# Patient Record
Sex: Female | Born: 1945 | Race: White | Hispanic: No | Marital: Married | Smoking: Former smoker
Health system: Southern US, Community
[De-identification: ages and names within clinical notes are randomized; demographics above are authoritative.]

## PROBLEM LIST (undated history)

## (undated) DIAGNOSIS — F0390 Unspecified dementia without behavioral disturbance: Secondary | ICD-10-CM

## (undated) DIAGNOSIS — E039 Hypothyroidism, unspecified: Secondary | ICD-10-CM

## (undated) DIAGNOSIS — E7800 Pure hypercholesterolemia, unspecified: Secondary | ICD-10-CM

## (undated) HISTORY — PX: HYSTEROTOMY: SHX1776

---

## 2005-11-05 ENCOUNTER — Encounter: Admission: RE | Admit: 2005-11-05 | Discharge: 2005-11-05 | Payer: Self-pay | Admitting: Family Medicine

## 2008-02-29 ENCOUNTER — Encounter: Admission: RE | Admit: 2008-02-29 | Discharge: 2008-02-29 | Payer: Self-pay | Admitting: Family Medicine

## 2009-09-04 ENCOUNTER — Emergency Department (HOSPITAL_COMMUNITY): Admission: EM | Admit: 2009-09-04 | Discharge: 2009-09-04 | Payer: Self-pay | Admitting: Emergency Medicine

## 2010-05-11 ENCOUNTER — Encounter: Payer: Self-pay | Admitting: Family Medicine

## 2012-05-22 ENCOUNTER — Encounter (HOSPITAL_COMMUNITY): Payer: Self-pay

## 2012-05-22 ENCOUNTER — Emergency Department (HOSPITAL_COMMUNITY)
Admission: EM | Admit: 2012-05-22 | Discharge: 2012-05-22 | Disposition: A | Discharge status: Home Health | Payer: Medicare Other | Attending: Emergency Medicine | Admitting: Emergency Medicine

## 2012-05-22 DIAGNOSIS — E7800 Pure hypercholesterolemia, unspecified: Secondary | ICD-10-CM | POA: Insufficient documentation

## 2012-05-22 DIAGNOSIS — E039 Hypothyroidism, unspecified: Secondary | ICD-10-CM | POA: Insufficient documentation

## 2012-05-22 DIAGNOSIS — F068 Other specified mental disorders due to known physiological condition: Secondary | ICD-10-CM | POA: Insufficient documentation

## 2012-05-22 DIAGNOSIS — Z79899 Other long term (current) drug therapy: Secondary | ICD-10-CM | POA: Insufficient documentation

## 2012-05-22 DIAGNOSIS — R197 Diarrhea, unspecified: Secondary | ICD-10-CM | POA: Insufficient documentation

## 2012-05-22 DIAGNOSIS — Y92009 Unspecified place in unspecified non-institutional (private) residence as the place of occurrence of the external cause: Secondary | ICD-10-CM | POA: Insufficient documentation

## 2012-05-22 DIAGNOSIS — R7401 Elevation of levels of liver transaminase levels: Secondary | ICD-10-CM | POA: Insufficient documentation

## 2012-05-22 DIAGNOSIS — R1110 Vomiting, unspecified: Secondary | ICD-10-CM | POA: Insufficient documentation

## 2012-05-22 DIAGNOSIS — R7402 Elevation of levels of lactic acid dehydrogenase (LDH): Secondary | ICD-10-CM | POA: Insufficient documentation

## 2012-05-22 DIAGNOSIS — Y939 Activity, unspecified: Secondary | ICD-10-CM | POA: Insufficient documentation

## 2012-05-22 DIAGNOSIS — IMO0002 Reserved for concepts with insufficient information to code with codable children: Secondary | ICD-10-CM | POA: Insufficient documentation

## 2012-05-22 DIAGNOSIS — T551X1A Toxic effect of detergents, accidental (unintentional), initial encounter: Secondary | ICD-10-CM | POA: Insufficient documentation

## 2012-05-22 HISTORY — DX: Pure hypercholesterolemia, unspecified: E78.00

## 2012-05-22 HISTORY — DX: Hypothyroidism, unspecified: E03.9

## 2012-05-22 HISTORY — DX: Unspecified dementia, unspecified severity, without behavioral disturbance, psychotic disturbance, mood disturbance, and anxiety: F03.90

## 2012-05-22 LAB — COMPREHENSIVE METABOLIC PANEL
BUN: 19 mg/dL (ref 6–23)
CO2: 26 mEq/L (ref 19–32)
Chloride: 97 mEq/L (ref 96–112)
Creatinine, Ser: 1.06 mg/dL (ref 0.50–1.10)
GFR calc Af Amer: 62 mL/min — ABNORMAL LOW (ref >90)
GFR calc non Af Amer: 54 mL/min — ABNORMAL LOW (ref >90)
Glucose, Bld: 158 mg/dL — ABNORMAL HIGH (ref 70–99)
Total Bilirubin: 0.5 mg/dL (ref 0.3–1.2)

## 2012-05-22 LAB — URINALYSIS, ROUTINE W REFLEX MICROSCOPIC
Glucose, UA: NEGATIVE mg/dL
Ketones, ur: NEGATIVE mg/dL
Leukocytes, UA: NEGATIVE
Nitrite: NEGATIVE
Protein, ur: NEGATIVE mg/dL

## 2012-05-22 LAB — CBC WITH DIFFERENTIAL/PLATELET
HCT: 43.1 % (ref 36.0–46.0)
Hemoglobin: 14.9 g/dL (ref 12.0–15.0)
Lymphocytes Relative: 8 % — ABNORMAL LOW (ref 12–46)
Monocytes Absolute: 0.9 10*3/uL (ref 0.1–1.0)
Monocytes Relative: 7 % (ref 3–12)
Neutro Abs: 10.9 10*3/uL — ABNORMAL HIGH (ref 1.7–7.7)
WBC: 12.8 10*3/uL — ABNORMAL HIGH (ref 4.0–10.5)

## 2012-05-22 LAB — ETHANOL: Alcohol, Ethyl (B): <11 mg/dL (ref 0–11)

## 2012-05-22 MED ORDER — ONDANSETRON HCL 4 MG/2ML IJ SOLN
4.0000 mg | Freq: Once | INTRAMUSCULAR | Status: AC
  Administered 2012-05-22: 4 mg via INTRAVENOUS
  Filled 2012-05-22: qty 2

## 2012-05-22 NOTE — ED Notes (Signed)
Pt has dementia and is non verbal. Lives at home with her husband.  Pt drank liquid dish detergent on Saturday around 1500 ([place])  EMS reports that pt has had diarrhea and vomiting since incident.  Pt alert on arrival to ED

## 2012-05-22 NOTE — ED Provider Notes (Signed)
History     CSN: [id number]  Arrival date & time 05/22/12  0230   First MD Initiated Contact with Patient 05/22/12 0327      Chief Complaint  Patient presents with  . Near Syncope    (Consider location/radiation/quality/duration/timing/severity/associated sxs/prior treatment) The history is provided by the spouse. The history is limited by the condition of the patient (dementia).   67 year old female drank Ajax cleaner at about 3 PM. History is from her husband because she is completely nonverbal secondary to dementia. He states that she did mix it with water and is not sure how much she drank other than he doesn't think was a lot. Several hours later, she had vomiting and diarrhea and this happened a second time about 8 hours later so he decided to bring her into the emergency department. She has had no complaints of. He does not think that she took any other substances or medications.  Past Medical History  Diagnosis Date  . Dementia   . Hypothyroid   . Hypercholesteremia     No past surgical history on file.  No family history on file.  History  Substance Use Topics  . Smoking status: Not on file  . Smokeless tobacco: Not on file  . Alcohol Use:     OB History    Grav Para Term Preterm Abortions TAB SAB Ect Mult Living                  Review of Systems  Unable to perform ROS: Dementia    Allergies  Review of patient's allergies indicates no known allergies.  Home Medications   Current Outpatient Rx  Name  Route  Sig  Dispense  Refill  . DONEPEZIL HCL 10 MG PO TABS   Oral   Take 10 mg by mouth at bedtime as needed.         Marland Kitchen ESTROGENS CONJUGATED 0.3 MG PO TABS   Oral   Take 0.3 mg by mouth daily.         Marland Kitchen LEVOTHYROXINE SODIUM 88 MCG PO TABS   Oral   Take 88 mcg by mouth daily.         Marland Kitchen MEMANTINE HCL 10 MG PO TABS   Oral   Take 10 mg by mouth 2 (two) times daily.         Marland Kitchen MIRABEGRON ER 25 MG PO TB24   Oral   Take 25 mg by mouth  daily.         Marland Kitchen SIMVASTATIN 40 MG PO TABS   Oral   Take 40 mg by mouth every evening.           BP 113/63  Pulse 95  Temp 98.1 F (36.7 C) (Oral)  Resp 20  SpO2 93%  Physical Exam  Nursing note and vitals reviewed.  67 year old female, resting comfortably and in no acute distress. Vital signs are normal. Oxygen saturation is 93%, which is normal. Head is normocephalic and atraumatic. PERRLA, EOMI. Oropharynx is clear. Neck is nontender and supple without adenopathy or JVD. Back is nontender and there is no CVA tenderness. Lungs are clear without rales, wheezes, or rhonchi. Chest is nontender. Heart has regular rate and rhythm without murmur. Abdomen is soft, flat, nontender without masses or hepatosplenomegaly and peristalsis is normoactive. Extremities have no cyanosis or edema, full range of motion is present. Skin is warm and dry without rash. Neurologic: She is completely nonverbal and does not follow commands, cranial  nerves are grossly intact, there are no gross motor or sensory deficits.  ED Course  Procedures (including critical care time)  Results for orders placed during the hospital encounter of 05/22/12  CBC WITH DIFFERENTIAL      Component Value Range   WBC 12.8 (*) 4.0 - 10.5 K/uL   RBC 4.94  3.87 - 5.11 MIL/uL   Hemoglobin 14.9  12.0 - 15.0 g/dL   HCT 11.9  14.7 - 82.9 %   MCV 87.2  78.0 - 100.0 fL   MCH 30.2  26.0 - 34.0 pg   MCHC 34.6  30.0 - 36.0 g/dL   RDW 56.2  13.0 - 86.5 %   Platelets 276  150 - 400 K/uL   Neutrophils Relative 85 (*) 43 - 77 %   Neutro Abs 10.9 (*) 1.7 - 7.7 K/uL   Lymphocytes Relative 8 (*) 12 - 46 %   Lymphs Abs 1.0  0.7 - 4.0 K/uL   Monocytes Relative 7  3 - 12 %   Monocytes Absolute 0.9  0.1 - 1.0 K/uL   Eosinophils Relative 0  0 - 5 %   Eosinophils Absolute 0.0  0.0 - 0.7 K/uL   Basophils Relative 0  0 - 1 %   Basophils Absolute 0.0  0.0 - 0.1 K/uL  COMPREHENSIVE METABOLIC PANEL      Component Value Range    Sodium 139  135 - 145 mEq/L   Potassium 4.2  3.5 - 5.1 mEq/L   Chloride 97  96 - 112 mEq/L   CO2 26  19 - 32 mEq/L   Glucose, Bld 158 (*) 70 - 99 mg/dL   BUN 19  6 - 23 mg/dL   Creatinine, Ser 7.84  0.50 - 1.10 mg/dL   Calcium 69.6 (*) 8.4 - 10.5 mg/dL   Total Protein 8.3  6.0 - 8.3 g/dL   Albumin 4.2  3.5 - 5.2 g/dL   AST 39 (*) 0 - 37 U/L   ALT 46 (*) 0 - 35 U/L   Alkaline Phosphatase 73  39 - 117 U/L   Total Bilirubin 0.5  0.3 - 1.2 mg/dL   GFR calc non Af Amer 54 (*) >90 mL/min   GFR calc Af Amer 62 (*) >90 mL/min  ETHANOL      Component Value Range   Alcohol, Ethyl (B) <11  0 - 11 mg/dL  URINALYSIS, ROUTINE W REFLEX MICROSCOPIC      Component Value Range   Color, Urine YELLOW  YELLOW   APPearance CLEAR  CLEAR   Specific Gravity, Urine 1.023  1.005 - 1.030   pH 5.0  5.0 - 8.0   Glucose, UA NEGATIVE  NEGATIVE mg/dL   Hgb urine dipstick NEGATIVE  NEGATIVE   Bilirubin Urine NEGATIVE  NEGATIVE   Ketones, ur NEGATIVE  NEGATIVE mg/dL   Protein, ur NEGATIVE  NEGATIVE mg/dL   Urobilinogen, UA 0.2  0.0 - 1.0 mg/dL   Nitrite NEGATIVE  NEGATIVE   Leukocytes, UA NEGATIVE  NEGATIVE  SALICYLATE LEVEL      Component Value Range   Salicylate Lvl <2.0 (*) 2.8 - 20.0 mg/dL  ACETAMINOPHEN LEVEL      Component Value Range   Acetaminophen (Tylenol), Serum <15.0  10 - 30 ug/mL    1. Ingestion of detergent or soap   2. Elevated transaminase level   3. Hypercalcemia       MDM  Ingestion of detergent. Vomiting and diarrhea are expected side effects but I  do not see evidence of true toxicity. Case is been discussed with [facility] poison control who recommended observation for one hour and then trial of low oral fluids as well as checking routine laboratory tests. CBC and complete metabolic panel as well as ethanol level and salicylate level and acetaminophen level have been ordered.  Laboratory workup is significant for mild elevation of transaminases and mild hypercalcemia. She  tolerated oral fluids well so she will be discharged with followup with gastroenterology. Laboratory abnormalities will need to be followed up through her PCP.  Dione Booze, MD 05/22/12 [phone]

## 2012-05-22 NOTE — ED Notes (Signed)
Husband home to get pt some clothes

## 2012-05-22 NOTE — ED Notes (Signed)
Pt drinking soda, tolerating well. Pt remains alert.

## 2012-05-22 NOTE — ED Notes (Signed)
Husband back at bedside.  Pt tolerated In and out cath well for urine.  Perineal area red with liquid stool in brief. Area cleaned and protective ointment applied.

## 2012-06-06 ENCOUNTER — Emergency Department (HOSPITAL_COMMUNITY): Payer: Medicare Other

## 2012-06-06 ENCOUNTER — Emergency Department (HOSPITAL_COMMUNITY)
Admission: EM | Admit: 2012-06-06 | Discharge: 2012-06-06 | Disposition: A | Discharge status: Home / Self Care | Payer: Medicare Other | Attending: Emergency Medicine | Admitting: Emergency Medicine

## 2012-06-06 DIAGNOSIS — Z79899 Other long term (current) drug therapy: Secondary | ICD-10-CM | POA: Insufficient documentation

## 2012-06-06 DIAGNOSIS — E039 Hypothyroidism, unspecified: Secondary | ICD-10-CM | POA: Insufficient documentation

## 2012-06-06 DIAGNOSIS — IMO0002 Reserved for concepts with insufficient information to code with codable children: Secondary | ICD-10-CM | POA: Insufficient documentation

## 2012-06-06 DIAGNOSIS — E162 Hypoglycemia, unspecified: Secondary | ICD-10-CM | POA: Insufficient documentation

## 2012-06-06 DIAGNOSIS — F0391 Unspecified dementia with behavioral disturbance: Secondary | ICD-10-CM | POA: Insufficient documentation

## 2012-06-06 DIAGNOSIS — F03918 Unspecified dementia, unspecified severity, with other behavioral disturbance: Secondary | ICD-10-CM | POA: Insufficient documentation

## 2012-06-06 DIAGNOSIS — R450 Nervousness: Secondary | ICD-10-CM | POA: Insufficient documentation

## 2012-06-06 DIAGNOSIS — E7800 Pure hypercholesterolemia, unspecified: Secondary | ICD-10-CM | POA: Insufficient documentation

## 2012-06-06 DIAGNOSIS — F0390 Unspecified dementia without behavioral disturbance: Secondary | ICD-10-CM

## 2012-06-06 LAB — COMPREHENSIVE METABOLIC PANEL
ALT: 38 U/L — ABNORMAL HIGH (ref 0–35)
AST: 33 U/L (ref 0–37)
Albumin: 3.5 g/dL (ref 3.5–5.2)
Alkaline Phosphatase: 64 U/L (ref 39–117)
BUN: 10 mg/dL (ref 6–23)
CO2: 23 mEq/L (ref 19–32)
Calcium: 9.2 mg/dL (ref 8.4–10.5)
Chloride: 100 mEq/L (ref 96–112)
Creatinine, Ser: 0.74 mg/dL (ref 0.50–1.10)
GFR calc Af Amer: >90 mL/min (ref >90)
GFR calc non Af Amer: 87 mL/min — ABNORMAL LOW (ref >90)
Glucose, Bld: 90 mg/dL (ref 70–99)
Potassium: 3.4 mEq/L — ABNORMAL LOW (ref 3.5–5.1)
Sodium: 139 mEq/L (ref 135–145)
Total Bilirubin: 0.3 mg/dL (ref 0.3–1.2)
Total Protein: 6.8 g/dL (ref 6.0–8.3)

## 2012-06-06 LAB — URINALYSIS, ROUTINE W REFLEX MICROSCOPIC
Glucose, UA: NEGATIVE mg/dL
Hgb urine dipstick: NEGATIVE
Protein, ur: NEGATIVE mg/dL

## 2012-06-06 LAB — CBC WITH DIFFERENTIAL/PLATELET
Basophils Absolute: 0 10*3/uL (ref 0.0–0.1)
Basophils Relative: 0 % (ref 0–1)
Eosinophils Absolute: 0.1 10*3/uL (ref 0.0–0.7)
Eosinophils Relative: 1 % (ref 0–5)
HCT: 38.7 % (ref 36.0–46.0)
Hemoglobin: 13 g/dL (ref 12.0–15.0)
Lymphocytes Relative: 25 % (ref 12–46)
Lymphs Abs: 1.2 10*3/uL (ref 0.7–4.0)
MCH: 28.6 pg (ref 26.0–34.0)
MCHC: 33.6 g/dL (ref 30.0–36.0)
MCV: 85.2 fL (ref 78.0–100.0)
Monocytes Absolute: 0.4 10*3/uL (ref 0.1–1.0)
Monocytes Relative: 7 % (ref 3–12)
Neutro Abs: 3.4 10*3/uL (ref 1.7–7.7)
Neutrophils Relative %: 67 % (ref 43–77)
Platelets: 260 10*3/uL (ref 150–400)
RBC: 4.54 MIL/uL (ref 3.87–5.11)
RDW: 12.7 % (ref 11.5–15.5)
WBC: 5 10*3/uL (ref 4.0–10.5)

## 2012-06-06 LAB — ETHANOL: Alcohol, Ethyl (B): <11 mg/dL (ref 0–11)

## 2012-06-06 LAB — RAPID URINE DRUG SCREEN, HOSP PERFORMED
Amphetamines: NOT DETECTED
Barbiturates: NOT DETECTED
Benzodiazepines: NOT DETECTED
Cocaine: NOT DETECTED
Opiates: NOT DETECTED
Tetrahydrocannabinol: NOT DETECTED

## 2012-06-06 LAB — GLUCOSE, CAPILLARY
Glucose-Capillary: 65 mg/dL — ABNORMAL LOW (ref 70–99)
Glucose-Capillary: 98 mg/dL (ref 70–99)

## 2012-06-06 LAB — AMMONIA: Ammonia: 18 umol/L (ref 11–60)

## 2012-06-06 MED ORDER — SODIUM CHLORIDE 0.9 % IV SOLN
1000.0000 mL | INTRAVENOUS | Status: DC
  Administered 2012-06-06: 1000 mL via INTRAVENOUS

## 2012-06-06 NOTE — ED Notes (Signed)
Attempted IV start x 2 without success. IV team paged.

## 2012-06-06 NOTE — ED Notes (Signed)
[id number]> Expected date:<BR> Expected time:<BR> Means of arrival:<BR> Comments:<BR> Pt is in room will be registered as Brandi Schneider

## 2012-06-06 NOTE — ED Notes (Signed)
Pt's husband at bedside. Registration notified to change pt's name and chart.

## 2012-06-06 NOTE — Progress Notes (Signed)
CSW met with pt spouse while patient was in xray. Per patient spouse, Pt name is Brandi Schneider. Mr. Vezina shared that Pt has history of dementia and is only oriented to self. Pt spouse stated that they went to [facility], and she had wandered off. Pt spouse stated that he looked for over 2 hours with security, and then called the police department who informed pt spouse pt was at the hospital. Pt spouse shared that he has tried to get patient into an assisted living for memory care but unable to afford it. Pt spouse stated that he has not applied for medicaid because there was no openings for medicaid beds. CSW and pt discussed the need to apply for medicaid to receive any financial assistance for assisted living. CSW provided pt spouse with medicaid information as well as list of assisted living facilities. Pt spouse plans to look into applying for medicaid and looking into assisted living facilities. CSW also provided patient with private care duty nurses to assist with pt needs at home. CSW discussed with pt spouse that once an assisted living facility is identified, pt pcp will need to complete and paperwork requested by facility, including the fl2.   CSW awaiting further medical evaluation to determine pt disposition needs. At this time plan is for pt to return home with pt spouse.   Catha Gosselin, LCSWA  [phone] .06/06/2012 1742pm

## 2012-06-06 NOTE — ED Notes (Signed)
Pt BIB EMS. Pt was found wandering around by [employer]. Pt has no signs of injury or trauma. Pt is nonverbal, but wrote down her name per EMS. Pt will not write down her birthday or identify herself in any other way. Pt arrives alert, makes eye contact will acknowledge her name is Brandi Schneider. Pt remains nonverbal to any questions. Pt calm, cooperative with staff.

## 2012-06-06 NOTE — ED Provider Notes (Signed)
History     CSN: [id number]  Arrival date & time 06/06/12  1550   First MD Initiated Contact with Patient 06/06/12 1649      Chief Complaint  Patient presents with  . Altered Mental Status    (Consider location/radiation/quality/duration/timing/severity/associated sxs/prior treatment) HPI Comments: Myriam Jacobson is a  Female who was found wandering outside by the police. She was transported here for evaluation. The only thing that she is able to communicate, is that her name is "Hector".She looks at the the examiner, and follows commands, but will not answer questions. She frequently repeats the same words that the examiner asks; as if she is asking the question.  Level V caveat -altered mental status   Patient is a 67 y.o. female presenting with altered mental status. The history is provided by the patient.  Altered Mental Status    No past medical history on file.  No past surgical history on file.  No family history on file.  History  Substance Use Topics  . Smoking status: Not on file  . Smokeless tobacco: Not on file  . Alcohol Use: Not on file    OB History   No data available      Review of Systems  Unable to perform ROS Psychiatric/Behavioral: Positive for altered mental status.    Allergies  Review of patient's allergies indicates no known allergies.  Home Medications   Current Outpatient Rx  Name  Route  Sig  Dispense  Refill  . donepezil (ARICEPT) 10 MG tablet   Oral   Take 10 mg by mouth every morning.         . estrogens, conjugated, (PREMARIN) 0.3 MG tablet   Oral   Take 0.3 mg by mouth every morning.         Marland Kitchen levothyroxine (SYNTHROID, LEVOTHROID) 88 MCG tablet   Oral   Take 88 mcg by mouth every morning.         . memantine (NAMENDA) 10 MG tablet   Oral   Take 10 mg by mouth 2 (two) times daily.         . mirabegron ER (MYRBETRIQ) 25 MG TB24   Oral   Take 25 mg by mouth every morning.         . simvastatin (ZOCOR) 40 MG  tablet   Oral   Take 40 mg by mouth every morning.           BP 136/75  Pulse 84  Temp(Src) 98.1 F (36.7 C) (Oral)  Resp 18  SpO2 98%  Physical Exam  Nursing note and vitals reviewed. Constitutional: She appears well-developed and well-nourished. She appears distressed (Appears uncomfortable, nervous).  HENT:  Head: Normocephalic and atraumatic.  Eyes: Conjunctivae and EOM are normal. Pupils are equal, round, and reactive to light.  Neck: Normal range of motion and phonation normal. Neck supple.  Cardiovascular: Normal rate, regular rhythm and intact distal pulses.   Pulmonary/Chest: Effort normal and breath sounds normal. She exhibits no tenderness.  Abdominal: Soft. She exhibits no distension. There is no tenderness. There is no guarding.  Musculoskeletal: Normal range of motion.  Neurological: She is alert. She has normal strength. She exhibits normal muscle tone (Strength 5 over 5 bilateral). Coordination normal.  Will not answer questions regarding orientation. No dysarthria. Unable to assess for aphasia.   Skin: Skin is warm and dry.  Psychiatric:  Depressed facies, anxious    ED Course  Procedures (including critical care time)  ED evaluation, for  causes of delirium , initiated; 17:15.    Date: 02/05/2012  Rate: 77  Rhythm: normal sinus rhythm  QRS Axis: normal  PR and QT Intervals: normal  ST/T Wave abnormalities: normal  PR and QRS Conduction Disutrbances:right bundle branch block  Narrative Interpretation:   Old EKG Reviewed: none available    Patient's husband arrived, and was able to give further history. He was with her at [facility] today, when she wandered away. He could not find her. He ultimately figured out she was brought to the hospital and came here to retrieve her. She feels like she is at her baseline. She has history of dementia and follows closely with her primary care Dr. Geronimo Running not had any recent illnesses.  Labs Reviewed  COMPREHENSIVE  METABOLIC PANEL - Abnormal; Notable for the following:    Potassium 3.4 (*)    ALT 38 (*)    GFR calc non Af Amer 87 (*)    All other components within normal limits  URINALYSIS, ROUTINE W REFLEX MICROSCOPIC - Abnormal; Notable for the following:    Bilirubin Urine SMALL (*)    Ketones, ur 40 (*)    All other components within normal limits  GLUCOSE, CAPILLARY - Abnormal; Notable for the following:    Glucose-Capillary 65 (*)    All other components within normal limits  URINE CULTURE  AMMONIA  URINE RAPID DRUG SCREEN (HOSP PERFORMED)  ETHANOL  LACTIC ACID, PLASMA  CBC WITH DIFFERENTIAL  GLUCOSE, CAPILLARY   Dg Chest 2 View  06/06/2012  *RADIOLOGY REPORT*  Clinical Data: Confusion, altered mental status.  CHEST - 2 VIEW  Comparison: None.  Findings: Relatively low lung volumes with atelectasis, scarring, or infiltrates in both lung bases, left greater than right.  The heart size upper limits normal for technique.  Atheromatous aorta. No effusion.  Regional bones unremarkable.  IMPRESSION:  Low volumes with bibasilar atelectasis, infiltrate or scarring.   Original Report Authenticated By: D. Andria Rhein, MD    Ct Head Wo Contrast  06/06/2012  *RADIOLOGY REPORT*  Clinical Data:   Altered mental status  CT HEAD WITHOUT CONTRAST  Technique:  Contiguous axial images were obtained from the base of the skull through the vertex without contrast.  Comparison: None currently available  Findings: Diffuse parenchymal atrophy especially bilateral frontal lobes. Patchy areas of hypoattenuation in deep and periventricular white matter bilaterally. Negative for acute intracranial hemorrhage, mass lesion, acute infarction, midline shift, or mass- effect. Acute infarct may be inapparent on noncontrast CT. Ventricles and sulci symmetric. Bone windows demonstrate no focal lesion.  IMPRESSION:  1. Negative for bleed or other acute intracranial process.  2. Atrophy and nonspecific white matter changes   Original  Report Authenticated By: D. Andria Rhein, MD    Nursing notes, applicable records and vitals reviewed.  Radiologic Images/Reports reviewed.   1. Hypoglycemia   2. Dementia       MDM  Dementia with wandering behavior. Doubt new brain disorder. Incidental, hypoglycemia, likely related to being away from her usual milieu. Doubt metabolic instability, serious bacterial infection or impending vascular collapse; the patient is stable for discharge.    Plan: Home Medications- usual; Home Treatments- close observation; Recommended follow up- PCP prn      Flint Melter, MD 06/06/12 2332

## 2012-06-06 NOTE — ED Notes (Signed)
IV team RN at bedside.  

## 2012-06-07 LAB — URINE CULTURE: Culture: NO GROWTH

## 2012-12-21 ENCOUNTER — Encounter (HOSPITAL_COMMUNITY): Payer: Self-pay | Admitting: Emergency Medicine

## 2012-12-21 ENCOUNTER — Emergency Department (HOSPITAL_COMMUNITY)
Admission: EM | Admit: 2012-12-21 | Discharge: 2012-12-21 | Disposition: A | Discharge status: Home / Self Care | Payer: Medicare Other | Attending: Emergency Medicine | Admitting: Emergency Medicine

## 2012-12-21 DIAGNOSIS — E039 Hypothyroidism, unspecified: Secondary | ICD-10-CM | POA: Insufficient documentation

## 2012-12-21 DIAGNOSIS — IMO0002 Reserved for concepts with insufficient information to code with codable children: Secondary | ICD-10-CM | POA: Insufficient documentation

## 2012-12-21 DIAGNOSIS — F0390 Unspecified dementia without behavioral disturbance: Secondary | ICD-10-CM | POA: Insufficient documentation

## 2012-12-21 DIAGNOSIS — T5791XA Toxic effect of unspecified inorganic substance, accidental (unintentional), initial encounter: Secondary | ICD-10-CM

## 2012-12-21 DIAGNOSIS — E7800 Pure hypercholesterolemia, unspecified: Secondary | ICD-10-CM | POA: Insufficient documentation

## 2012-12-21 DIAGNOSIS — T189XXA Foreign body of alimentary tract, part unspecified, initial encounter: Secondary | ICD-10-CM | POA: Insufficient documentation

## 2012-12-21 DIAGNOSIS — Y9389 Activity, other specified: Secondary | ICD-10-CM | POA: Insufficient documentation

## 2012-12-21 DIAGNOSIS — Y9289 Other specified places as the place of occurrence of the external cause: Secondary | ICD-10-CM | POA: Insufficient documentation

## 2012-12-21 DIAGNOSIS — Z79899 Other long term (current) drug therapy: Secondary | ICD-10-CM | POA: Insufficient documentation

## 2012-12-21 NOTE — ED Provider Notes (Signed)
CSN: [id number]     Arrival date & time 12/21/12  1552 History   First MD Initiated Contact with Patient 12/21/12 1715     Chief Complaint  Patient presents with  . Ingestion   (Consider location/radiation/quality/duration/timing/severity/associated sxs/prior Treatment) HPI Comments: 67 year old female with a history of dementia who presents with her family after ingesting an unknown amount of her own feces. Her daughter cleaned her up, and then called her doctor's office. They suggested she come to the emergency department for evaluation. Her daughter denies that she is acting any differently than normal, as the ingestion. Specifically, she has had no shortness of breath, cough, vomiting. Remainder of history is limited secondary to patient's dementia.  Patient is a 66 y.o. female presenting with Ingested Medication.  Ingestion This is a new problem. The current episode started 3 to 5 hours ago. Episode frequency: once. The problem has been resolved. Pertinent negatives include no chest pain, no abdominal pain and no shortness of breath. Nothing aggravates the symptoms. Nothing relieves the symptoms.    Past Medical History  Diagnosis Date  . Dementia   . Hypothyroid   . Hypercholesteremia    History reviewed. No pertinent past surgical history. History reviewed. No pertinent family history. History  Substance Use Topics  . Smoking status: Not on file  . Smokeless tobacco: Not on file  . Alcohol Use:    OB History   Grav Para Term Preterm Abortions TAB SAB Ect Mult Living                 Review of Systems  Unable to perform ROS: Dementia  Respiratory: Negative for shortness of breath.   Cardiovascular: Negative for chest pain.  Gastrointestinal: Negative for abdominal pain.    Allergies  Review of patient's allergies indicates no known allergies.  Home Medications   Current Outpatient Rx  Name  Route  Sig  Dispense  Refill  . cholecalciferol (VITAMIN D) 1000 UNITS  tablet   Oral   Take 1,000 Units by mouth daily.         . Coconut Oil OIL   Oral   Take 45 mLs by mouth 3 (three) times daily.         Marland Kitchen donepezil (ARICEPT) 10 MG tablet   Oral   Take 10 mg by mouth every morning.         . estrogens, conjugated, (PREMARIN) 0.3 MG tablet   Oral   Take 0.3 mg by mouth daily.         Marland Kitchen levothyroxine (SYNTHROID, LEVOTHROID) 88 MCG tablet   Oral   Take 88 mcg by mouth daily.         . memantine (NAMENDA) 10 MG tablet   Oral   Take 10 mg by mouth 2 (two) times daily.         . mirabegron ER (MYRBETRIQ) 25 MG TB24   Oral   Take 25 mg by mouth daily.         . mirtazapine (REMERON) 15 MG tablet   Oral   Take 15 mg by mouth at bedtime.         Marland Kitchen MIRTAZAPINE PO   Oral   Take by mouth.         Marland Kitchen OVER THE COUNTER MEDICATION   Oral   Take 500 mg by mouth daily. "Tumeric"         . simvastatin (ZOCOR) 40 MG tablet   Oral   Take 20 mg by  mouth every morning.           BP 126/89  Pulse 104  Temp(Src) 98.2 F (36.8 C) (Oral)  Resp 18  SpO2 96% Physical Exam  Nursing note and vitals reviewed. Constitutional: She is oriented to person, place, and time. She appears well-developed and well-nourished. No distress.  HENT:  Head: Normocephalic and atraumatic.  Mouth/Throat: Oropharynx is clear and moist.  Eyes: Conjunctivae are normal. Pupils are equal, round, and reactive to light. No scleral icterus.  Neck: Neck supple.  Cardiovascular: Normal rate, regular rhythm, normal heart sounds and intact distal pulses.   No murmur heard. Pulmonary/Chest: Effort normal and breath sounds normal. No stridor. No respiratory distress. She has no rales.  Abdominal: Soft. Bowel sounds are normal. She exhibits no distension. There is no tenderness.  Musculoskeletal: Normal range of motion.  Neurological: She is alert and oriented to person, place, and time.  Skin: Skin is warm and dry. No rash noted.  Psychiatric: She has a normal  mood and affect. Her behavior is normal.    ED Course  Procedures (including critical care time) Labs Review Labs Reviewed - No data to display Imaging Review No results found.  MDM   1. Ingestion of substance, initial encounter    Well appearing, at baseline mental status. No apparent complications from her feces ingestion. Abdomen is soft and nontender. Have given family return precautions, and advised PCP followup. Do not think that she needs further emergent evaluation.    Candyce Churn, MD 12/21/12 [phone]

## 2012-12-21 NOTE — ED Notes (Signed)
Pt here for eval after eating her own feces; pt demented at baseline and is cared for by family at home; family is ok to continue to care for her just want to make sure she is ok; no complaints

## 2013-02-16 ENCOUNTER — Encounter: Payer: Self-pay | Admitting: Gastroenterology

## 2013-03-20 ENCOUNTER — Encounter: Payer: Self-pay | Admitting: Gastroenterology

## 2013-03-20 ENCOUNTER — Ambulatory Visit (INDEPENDENT_AMBULATORY_CARE_PROVIDER_SITE_OTHER): Payer: Medicare Other | Admitting: Gastroenterology

## 2013-03-20 VITALS — BP 118/78 | HR 91 | Ht 66.0 in | Wt 162.0 lb

## 2013-03-20 DIAGNOSIS — R945 Abnormal results of liver function studies: Secondary | ICD-10-CM

## 2013-03-20 NOTE — Assessment & Plan Note (Signed)
The patient has a mild transaminitis.  There's a note indicating she has hepatitis B but no supporting lab.  Chronic hepatitis certainly could account for LFT abnormalities.  I will await further information before making recommendations.

## 2013-03-20 NOTE — Patient Instructions (Signed)
We will follow up after Dr Arlyce Dice reviews records

## 2013-03-20 NOTE — Progress Notes (Signed)
History of Present Illness: 66 year old white female with advanced dementia referred for evaluation of abnormal liver tests.  In February, 2014 ALT was 38 and AST 33.  In October, 2014 AST was 107 and ALT 114.  She apparently had recent blood work that is not yet available and an ultrasound.  There is a note signifying that she has hepatitis B.  The patient received blood transfusions in the 70s.  She has no alcohol use or history of hepatitis.  She is unable to provide a history.    Past Medical History  Diagnosis Date  . Dementia   . Hypothyroid   . Hypercholesteremia    Past Surgical History  Procedure Laterality Date  . Hysterotomy     family history includes Breast cancer in her mother; Stroke in her father. Current Outpatient Prescriptions  Medication Sig Dispense Refill  . cholecalciferol (VITAMIN D) 1000 UNITS tablet Take 1,000 Units by mouth daily.      . Coconut Oil OIL Take 45 mLs by mouth 3 (three) times daily.      Marland Kitchen donepezil (ARICEPT) 10 MG tablet Take 10 mg by mouth every morning.      . estrogens, conjugated, (PREMARIN) 0.3 MG tablet Take 0.3 mg by mouth daily.      Marland Kitchen levothyroxine (SYNTHROID, LEVOTHROID) 88 MCG tablet Take 88 mcg by mouth daily.      . memantine (NAMENDA) 10 MG tablet Take 10 mg by mouth 2 (two) times daily.      . mirabegron ER (MYRBETRIQ) 25 MG TB24 Take 25 mg by mouth daily.      . mirtazapine (REMERON) 15 MG tablet Take 15 mg by mouth at bedtime.      Marland Kitchen MIRTAZAPINE PO Take by mouth.      Marland Kitchen OVER THE COUNTER MEDICATION Take 500 mg by mouth daily. "Tumeric"       No current facility-administered medications for this visit.   Allergies as of 03/20/2013  . (No Known Allergies)    reports that she has quit smoking. She has never used smokeless tobacco. She reports that she does not drink alcohol or use illicit drugs.     Review of Systems: Pertinent positive and negative review of systems were noted in the above HPI section. All other review of  systems were otherwise negative.  Vital signs were reviewed in today's medical record Physical Exam: General: Well developed , well nourished, no acute distress Skin: anicteric Head: Normocephalic and atraumatic Eyes:  sclerae anicteric, EOMI Ears: Normal auditory acuity Mouth: No deformity or lesions Neck: Supple, no masses or thyromegaly Lungs: Clear throughout to auscultation Heart: Regular rate and rhythm; no murmurs, rubs or bruits Abdomen: Soft, non tender and non distended. No masses, hepatosplenomegaly or hernias noted. Normal Bowel sounds Rectal:deferred Musculoskeletal: Symmetrical with no gross deformities  Skin: No lesions on visible extremities Pulses:  Normal pulses noted Extremities: No clubbing, cyanosis, edema or deformities noted Neurological: Alert oriented , grossly nonfocal, nonverbal Cervical Nodes:  No significant cervical adenopathy Inguinal Nodes: No significant inguinal adenopathy Psychological:  Alert and cooperative. Normal mood and affect

## 2013-03-27 ENCOUNTER — Telehealth: Payer: Self-pay | Admitting: Gastroenterology

## 2013-03-27 DIAGNOSIS — R945 Abnormal results of liver function studies: Secondary | ICD-10-CM

## 2013-03-27 NOTE — Telephone Encounter (Signed)
Dr. Arlyce Dice this pts daughter is calling wanting to know if we received records on this pt from Dr. Feliz Beam office and what the next step is for her mother. Please advise.

## 2013-03-31 NOTE — Telephone Encounter (Signed)
I don't recall specifically seen these notes.  I don't see where there were scant in either.  Which are currently followup on this and try to get these notes to my attention?

## 2013-03-31 NOTE — Telephone Encounter (Signed)
Brandi Schneider have you seen records on this pt from a Dr. Drue Second?

## 2013-04-04 ENCOUNTER — Telehealth: Payer: Self-pay | Admitting: Gastroenterology

## 2013-04-05 NOTE — Telephone Encounter (Signed)
Spoke with pts daughter that the records are at an off site scanning facility at this time

## 2013-04-05 NOTE — Telephone Encounter (Signed)
Brandi Schneider have you seen these records?

## 2013-04-05 NOTE — Telephone Encounter (Signed)
Please check again to see whether we received the patient's records.  If so place them on my desk again.  Thanks

## 2013-04-05 NOTE — Telephone Encounter (Signed)
See other phone note

## 2013-04-05 NOTE — Telephone Encounter (Signed)
We received the records  They were sent to our scanning center to be scanned in

## 2013-04-18 NOTE — Telephone Encounter (Signed)
Dr Arlyce Dice What have we decided to do with this patient  Records have been scanned in

## 2013-04-20 NOTE — Telephone Encounter (Signed)
Needs serologies for Hep B and C

## 2013-04-21 NOTE — Telephone Encounter (Signed)
Patient needs labs drawn orders are in the computer

## 2013-05-01 NOTE — Telephone Encounter (Signed)
Patients daughter to bring her in this week for labs to be drawn

## 2013-05-03 ENCOUNTER — Other Ambulatory Visit: Payer: Medicare Other

## 2013-05-03 DIAGNOSIS — R7989 Other specified abnormal findings of blood chemistry: Secondary | ICD-10-CM

## 2013-05-03 DIAGNOSIS — R945 Abnormal results of liver function studies: Secondary | ICD-10-CM

## 2013-05-04 LAB — HEPATITIS B CORE ANTIBODY, IGM: HEP B C IGM: NONREACTIVE

## 2013-05-04 LAB — HEPATITIS C ANTIBODY: HCV AB: NEGATIVE

## 2013-05-04 LAB — HEPATITIS B SURFACE ANTIBODY,QUALITATIVE: HEP B S AB: NEGATIVE

## 2013-05-04 LAB — HEPATITIS B SURFACE ANTIGEN: Hepatitis B Surface Ag: NEGATIVE

## 2013-05-10 ENCOUNTER — Encounter: Payer: Self-pay | Admitting: Gastroenterology

## 2013-05-10 ENCOUNTER — Telehealth: Payer: Self-pay

## 2013-05-10 NOTE — Progress Notes (Signed)
Patient ID: Brandi Schneider, female   DOB: 1945-05-23, 68 y.o.   MRN: [id number]  Ultrasound report from February 06, 2013 demonstrated a fatty liver.

## 2013-05-10 NOTE — Telephone Encounter (Signed)
Tried to view Care Everywhere to obtain results from US on 02/07/13.  Results not available. I have contacted [place] Dr. Feliz BeamSnider's office and left a message to obtain the US from 02/07/13 at their office.  Brandi Schneider stated she will fax a copy of the report for Dr. Arlyce DiceKaplan to view

## 2013-05-15 ENCOUNTER — Telehealth: Payer: Self-pay | Admitting: *Deleted

## 2013-05-15 DIAGNOSIS — R945 Abnormal results of liver function studies: Secondary | ICD-10-CM

## 2013-05-15 DIAGNOSIS — R7989 Other specified abnormal findings of blood chemistry: Secondary | ICD-10-CM

## 2013-05-15 NOTE — Telephone Encounter (Signed)
Message copied by Marlowe KaysSTALLINGS, Arlyss Weathersby M on Mon May 15, 2013  1:51 PM ------      Message from: Melvia HeapsKAPLAN, ROBERT D      Created: Fri May 12, 2013  1:29 PM       Pt needs Fe/TIBC/ferritin, AMA, ANA, cerruloplasmin, alpha 1 antitrypsin level, then OV ------

## 2013-05-16 NOTE — Telephone Encounter (Signed)
Spoke with patient she will be in this week to have labs drawn Will call back for an appointment

## 2013-06-30 ENCOUNTER — Other Ambulatory Visit (INDEPENDENT_AMBULATORY_CARE_PROVIDER_SITE_OTHER): Payer: Medicare Other

## 2013-06-30 DIAGNOSIS — R7989 Other specified abnormal findings of blood chemistry: Secondary | ICD-10-CM

## 2013-06-30 DIAGNOSIS — R945 Abnormal results of liver function studies: Secondary | ICD-10-CM

## 2013-06-30 LAB — IBC PANEL
Iron: 112 ug/dL (ref 42–145)
SATURATION RATIOS: 23.1 % (ref 20.0–50.0)
Transferrin: 345.9 mg/dL (ref 212.0–360.0)

## 2013-07-03 LAB — MITOCHONDRIAL ANTIBODIES: MITOCHONDRIAL M2 AB, IGG: 0.3 (ref <0.91)

## 2013-07-03 LAB — CERULOPLASMIN: CERULOPLASMIN: 32 mg/dL (ref 18–53)

## 2013-07-03 LAB — ALPHA-1-ANTITRYPSIN: A1 ANTITRYPSIN SER: 170 mg/dL (ref 83–199)

## 2013-07-03 LAB — ANA: Anti Nuclear Antibody(ANA): NEGATIVE

## 2014-03-14 ENCOUNTER — Inpatient Hospital Stay (HOSPITAL_COMMUNITY)
Admission: EM | Admit: 2014-03-14 | Discharge: 2014-03-20 | DRG: 551 | Disposition: A | Discharge status: Home Health | Payer: Medicare Other | Attending: Internal Medicine | Admitting: Internal Medicine

## 2014-03-14 DIAGNOSIS — F0280 Dementia in other diseases classified elsewhere without behavioral disturbance: Secondary | ICD-10-CM

## 2014-03-14 DIAGNOSIS — I442 Atrioventricular block, complete: Secondary | ICD-10-CM | POA: Clinically undetermined

## 2014-03-14 DIAGNOSIS — M4854XA Collapsed vertebra, not elsewhere classified, thoracic region, initial encounter for fracture: Secondary | ICD-10-CM | POA: Diagnosis present

## 2014-03-14 DIAGNOSIS — Z7401 Bed confinement status: Secondary | ICD-10-CM

## 2014-03-14 DIAGNOSIS — S129XXA Fracture of neck, unspecified, initial encounter: Secondary | ICD-10-CM | POA: Diagnosis present

## 2014-03-14 DIAGNOSIS — E780 Pure hypercholesterolemia: Secondary | ICD-10-CM | POA: Diagnosis present

## 2014-03-14 DIAGNOSIS — Z87891 Personal history of nicotine dependence: Secondary | ICD-10-CM

## 2014-03-14 DIAGNOSIS — S52132A Displaced fracture of neck of left radius, initial encounter for closed fracture: Secondary | ICD-10-CM

## 2014-03-14 DIAGNOSIS — E039 Hypothyroidism, unspecified: Secondary | ICD-10-CM

## 2014-03-14 DIAGNOSIS — G3109 Other frontotemporal dementia: Secondary | ICD-10-CM | POA: Diagnosis present

## 2014-03-14 DIAGNOSIS — R32 Unspecified urinary incontinence: Secondary | ICD-10-CM | POA: Diagnosis present

## 2014-03-14 DIAGNOSIS — T50905A Adverse effect of unspecified drugs, medicaments and biological substances, initial encounter: Secondary | ICD-10-CM | POA: Diagnosis not present

## 2014-03-14 DIAGNOSIS — S22000A Wedge compression fracture of unspecified thoracic vertebra, initial encounter for closed fracture: Secondary | ICD-10-CM

## 2014-03-14 DIAGNOSIS — R159 Full incontinence of feces: Secondary | ICD-10-CM | POA: Diagnosis present

## 2014-03-14 DIAGNOSIS — S12190A Other displaced fracture of second cervical vertebra, initial encounter for closed fracture: Secondary | ICD-10-CM | POA: Diagnosis present

## 2014-03-14 DIAGNOSIS — I469 Cardiac arrest, cause unspecified: Secondary | ICD-10-CM | POA: Diagnosis not present

## 2014-03-14 DIAGNOSIS — S12110A Anterior displaced Type II dens fracture, initial encounter for closed fracture: Principal | ICD-10-CM | POA: Diagnosis present

## 2014-03-14 DIAGNOSIS — S12201A Unspecified nondisplaced fracture of third cervical vertebra, initial encounter for closed fracture: Secondary | ICD-10-CM | POA: Diagnosis present

## 2014-03-14 DIAGNOSIS — S12031A Nondisplaced posterior arch fracture of first cervical vertebra, initial encounter for closed fracture: Secondary | ICD-10-CM | POA: Diagnosis present

## 2014-03-14 DIAGNOSIS — W19XXXA Unspecified fall, initial encounter: Secondary | ICD-10-CM | POA: Diagnosis present

## 2014-03-14 DIAGNOSIS — I441 Atrioventricular block, second degree: Secondary | ICD-10-CM | POA: Clinically undetermined

## 2014-03-14 DIAGNOSIS — I4510 Unspecified right bundle-branch block: Secondary | ICD-10-CM | POA: Diagnosis present

## 2014-03-14 DIAGNOSIS — W108XXA Fall (on) (from) other stairs and steps, initial encounter: Secondary | ICD-10-CM

## 2014-03-14 DIAGNOSIS — S12100A Unspecified displaced fracture of second cervical vertebra, initial encounter for closed fracture: Secondary | ICD-10-CM

## 2014-03-14 DIAGNOSIS — S52122A Displaced fracture of head of left radius, initial encounter for closed fracture: Secondary | ICD-10-CM | POA: Diagnosis present

## 2014-03-14 DIAGNOSIS — R001 Bradycardia, unspecified: Secondary | ICD-10-CM | POA: Diagnosis present

## 2014-03-14 DIAGNOSIS — T1490XA Injury, unspecified, initial encounter: Secondary | ICD-10-CM

## 2014-03-14 DIAGNOSIS — J9811 Atelectasis: Secondary | ICD-10-CM | POA: Diagnosis present

## 2014-03-14 DIAGNOSIS — Z79899 Other long term (current) drug therapy: Secondary | ICD-10-CM

## 2014-03-14 DIAGNOSIS — IMO0002 Reserved for concepts with insufficient information to code with codable children: Secondary | ICD-10-CM | POA: Insufficient documentation

## 2014-03-14 DIAGNOSIS — Y92009 Unspecified place in unspecified non-institutional (private) residence as the place of occurrence of the external cause: Secondary | ICD-10-CM

## 2014-03-14 DIAGNOSIS — S51012A Laceration without foreign body of left elbow, initial encounter: Secondary | ICD-10-CM | POA: Diagnosis present

## 2014-03-14 NOTE — ED Notes (Addendum)
Per EMS: pt coming from home with c/o fall. Per family, the pt had an unwitnessed fall down approximately 10 carpeted stairs, landing on hardwood floor at the bottom. Pt is alert to her norm, pt is nonverbal, will follow some commands, daughter at bedside. Pt presents with abrasions to her right foot, left hip, and to the top of her head. Pt O2 sats 85% on RA, pt O2 sats increased to 90% on 4 lpm. Pt is taking shallow breaths. Pt unable to state if she had spinal or C-spine tenderness. Unknown LOC

## 2014-03-14 NOTE — ED Provider Notes (Signed)
CSN: [id number]     Arrival date & time 03/14/14  2343 History  This chart was scribed for Warnell Forester, MD by Carl Best, ED Scribe. This patient was seen in room B15C/B15C and the patient's care was started at 12:02 AM.      Chief Complaint  Patient presents with  . Fall   LEVEL 5 CAVEAT - DEMENTIA  Patient is a 68 y.o. female presenting with fall. The history is provided by a relative. No language interpreter was used.  Fall   HPI Comments: Brandi Schneider is a 68 y.o. female with a history of Dementia who presents to the Emergency Department complaining of a fall that occurred at 10:45 PM this evening.  The patient's family member states that she fell down 12-14 steps and landed on the hardwood floor with her head under a metal shelf.  The patient did not lose consciousness and did not have tremors.  She states that the patient has lesions on her head and arms.  The patient's daughter states that she has been grunting since the fall which is baseline behavior for the patient.  She is nonverbal due to her dementia.    Past Medical History  Diagnosis Date  . Dementia   . Hypothyroid   . Hypercholesteremia    Past Surgical History  Procedure Laterality Date  . Hysterotomy     Family History  Problem Relation Age of Onset  . Breast cancer Mother   . Stroke Father    History  Substance Use Topics  . Smoking status: Former Games developer  . Smokeless tobacco: Never Used  . Alcohol Use: No   OB History    No data available     Review of Systems  Unable to perform ROS: Dementia    Allergies  Review of patient's allergies indicates no known allergies.  Home Medications   Prior to Admission medications   Medication Sig Start Date End Date Taking? Authorizing Provider  cholecalciferol (VITAMIN D) 1000 UNITS tablet Take 1,000 Units by mouth daily.    Historical Provider, MD  Coconut Oil OIL Take 45 mLs by mouth 3 (three) times daily.    Historical Provider, MD  donepezil  (ARICEPT) 10 MG tablet Take 10 mg by mouth every morning.    Historical Provider, MD  estrogens, conjugated, (PREMARIN) 0.3 MG tablet Take 0.3 mg by mouth daily.    Historical Provider, MD  levothyroxine (SYNTHROID, LEVOTHROID) 88 MCG tablet Take 88 mcg by mouth daily.    Historical Provider, MD  memantine (NAMENDA) 10 MG tablet Take 10 mg by mouth 2 (two) times daily.    Historical Provider, MD  mirabegron ER (MYRBETRIQ) 25 MG TB24 Take 25 mg by mouth daily.    Historical Provider, MD  mirtazapine (REMERON) 15 MG tablet Take 15 mg by mouth at bedtime.    Historical Provider, MD  MIRTAZAPINE PO Take by mouth.    Historical Provider, MD  OVER THE COUNTER MEDICATION Take 500 mg by mouth daily. "Tumeric"    Historical Provider, MD   BP 126/85 mmHg  Pulse 91  Resp 20  Ht 5\' 6"  (1.676 m)  Wt 160 lb (72.576 kg)  BMI 25.84 kg/m2  SpO2 89%   Physical Exam  Constitutional: She appears well-developed and well-nourished. No distress.  HENT:  Head: Normocephalic and atraumatic. Head is without raccoon's eyes and without Battle's sign.    Nose: Nose normal.  Eyes: Conjunctivae and EOM are normal. Pupils are equal, round, and reactive  to light. No scleral icterus.  Neck: No spinous process tenderness and no muscular tenderness present.  Cardiovascular: Normal rate, regular rhythm, normal heart sounds and intact distal pulses.   No murmur heard. Pulmonary/Chest: Effort normal and breath sounds normal. She has no rales. She exhibits no tenderness.  Abdominal: Soft. There is no tenderness. There is no rebound and no guarding.  Musculoskeletal: Normal range of motion. She exhibits no edema or tenderness.       Thoracic back: She exhibits no tenderness and no bony tenderness.       Lumbar back: She exhibits no tenderness and no bony tenderness.  No evidence of trauma to extremities, except as noted.  2+ distal pulses.    Neurological: She is alert.  Nonverbal  Skin: Skin is warm and dry. No rash  noted.  Psychiatric:  Unable to test  Nursing note and vitals reviewed.   ED Course  CRITICAL CARE Performed by: Warnell ForesterWOFFORD, TREY Authorized by: Warnell ForesterWOFFORD, TREY Total critical care time: 40 minutes Critical care time was exclusive of separately billable procedures and treating other patients. Critical care was necessary to treat or prevent imminent or life-threatening deterioration of the following conditions: trauma. Critical care was time spent personally by me on the following activities: development of treatment plan with patient or surrogate, discussions with consultants, evaluation of patient's response to treatment, examination of patient, obtaining history from patient or surrogate, ordering and performing treatments and interventions, ordering and review of laboratory studies, ordering and review of radiographic studies, pulse oximetry, re-evaluation of patient's condition and review of old charts.   (including critical care time)  DIAGNOSTIC STUDIES: Oxygen Saturation is 90% on room air, low by my interpretation.    COORDINATION OF CARE: 12:10 AM- Discussed obtaining a CT scan of her head and x-rays of her chest, hips, and left elbow.  The patient's daughter agreed to the treatment plan.   Labs Review Labs Reviewed - No data to display  Imaging Review Dg Chest 1 View  03/15/2014   CLINICAL DATA:  Fall.  Dementia  EXAM: CHEST - 1 VIEW  COMPARISON:  None  FINDINGS: Normal heart size. There is no pleural effusion identified. Low lung volumes and asymmetric elevation of the right hemidiaphragm noted. Scar versus platelike atelectasis is identified in the left base. There is coarsened interstitial markings noted bilaterally.  IMPRESSION: 1. Left base scar versus atelectasis. 2. Diffuse, chronic appearing interstitial coarsening.   Electronically Signed   By: Signa Kellaylor  Stroud M.D.   On: 03/15/2014 01:46   Dg Pelvis 1-2 Views  03/15/2014   CLINICAL DATA:  Initial evaluation for acute  trauma.  Fall.  EXAM: PELVIS - 1-2 VIEW  COMPARISON:  None.  FINDINGS: No definite fracture identified. Linear lucency traversing the left pubis symphysis is favored to be chronic in nature or perhaps related to summation of overlying soft tissue shadows. No pelvic bone lesions are seen. SI joints are approximated. Lower lumbar spine grossly unremarkable.  IMPRESSION: 1. Age-indeterminate linear lucency traversing the left pubis symphysis. While this finding may be chronic in nature or perhaps related to summation of shadows, possible acute fracture is not excluded. Correlation with physical exam for possible fracture at this site recommended. 2. No other acute traumatic injury within the pelvis.   Electronically Signed   By: Rise MuBenjamin  McClintock M.D.   On: 03/15/2014 01:51   Dg Elbow Complete Left  03/15/2014   CLINICAL DATA:  Fall.  Elbow laceration.  EXAM: LEFT ELBOW - COMPLETE 3+  VIEW  COMPARISON:  None.  FINDINGS: Cortical offset along the radial neck, only visualized in the frontal projection. Prominent anterior elbow fat pad suggesting small joint effusion. No malalignment.  IMPRESSION: Suspect a nondisplaced radial neck fracture.   Electronically Signed   By: Tiburcio Pea M.D.   On: 03/15/2014 01:49   Ct Head Wo Contrast  03/15/2014   CLINICAL DATA:  Fall.  EXAM: CT HEAD WITHOUT CONTRAST  CT CERVICAL SPINE WITHOUT CONTRAST  TECHNIQUE: Multidetector CT imaging of the head and cervical spine was performed following the standard protocol without intravenous contrast. Multiplanar CT image reconstructions of the cervical spine were also generated.  COMPARISON:  09/04/09  FINDINGS: CT HEAD FINDINGS  Prominence of the sulci and ventricles consistent with brain atrophy. Chronic right basal ganglia lacunar infarct noted. No evidence for acute intracranial hemorrhage, infarct or mass. The paranasal sinuses are clear. The mastoid air cells are clear. The calvarium appears intact.  CT CERVICAL SPINE FINDINGS   Straightening of normal cervical lordosis. The vertebral body heights are well preserved. There is multi level disc space narrowing and ventral endplate spurring compatible with degenerative disc disease. The facet joints are aligned bilaterally. There is a nondisplaced fracture involving the posterior arch of C1 on the left, image 28/series 8. There is a nondisplaced base of dens fracture which extends into the left side of the vertebral body. Comminuted fracture deformity involves the left-sided transverse process of C2 with mild posterior displacement of fracture fragments into the canal of the left vertebral artery. The left posterior arch of C3 fracture is also noted lower cervical spine appears intact.  IMPRESSION: 1. No acute intracranial abnormalities. 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 2. Base of dens fracture which extends into the left side of the vertebral body. 3. Comminuted fracture involving the left transverse process of the C2 vertebra with possible involvement of the vertebral canal 4. C3 Left lamina fracture and C1 left posterior arch fracture, nondisplaced 5. Cervical degenerative disc disease. 6. Critical Value/emergent results were called by telephone at the time of interpretation on 03/15/2014 at 2:01 am to Dr. Warnell Forester , who verbally acknowledged these results.   Electronically Signed   By: Signa Kell M.D.   On: 03/15/2014 02:03   Ct Cervical Spine Wo Contrast  03/15/2014   CLINICAL DATA:  Fall.  EXAM: CT HEAD WITHOUT CONTRAST  CT CERVICAL SPINE WITHOUT CONTRAST  TECHNIQUE: Multidetector CT imaging of the head and cervical spine was performed following the standard protocol without intravenous contrast. Multiplanar CT image reconstructions of the cervical spine were also generated.  COMPARISON:  09/04/09  FINDINGS: CT HEAD FINDINGS  Prominence of the sulci and ventricles consistent with brain atrophy. Chronic right basal ganglia lacunar infarct noted. No  evidence for acute intracranial hemorrhage, infarct or mass. The paranasal sinuses are clear. The mastoid air cells are clear. The calvarium appears intact.  CT CERVICAL SPINE FINDINGS  Straightening of normal cervical lordosis. The vertebral body heights are well preserved. There is multi level disc space narrowing and ventral endplate spurring compatible with degenerative disc disease. The facet joints are aligned bilaterally. There is a nondisplaced fracture involving the posterior arch of C1 on the left, image 28/series 8. There is a nondisplaced base of dens fracture which extends into the left side of the vertebral body. Comminuted fracture deformity involves the left-sided transverse process of C2 with mild posterior displacement of fracture fragments into the canal of the left vertebral artery. The left  posterior arch of C3 fracture is also noted lower cervical spine appears intact.  IMPRESSION: 1. No acute intracranial abnormalities. 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 2. Base of dens fracture which extends into the left side of the vertebral body. 3. Comminuted fracture involving the left transverse process of the C2 vertebra with possible involvement of the vertebral canal 4. C3 Left lamina fracture and C1 left posterior arch fracture, nondisplaced 5. Cervical degenerative disc disease. 6. Critical Value/emergent results were called by telephone at the time of interpretation on 03/15/2014 at 2:01 am to Dr. Warnell ForesterREY Auriel Kist , who verbally acknowledged these results.   Electronically Signed   By: Signa Kellaylor  Stroud M.D.   On: 03/15/2014 02:03  All radiology studies independently viewed by me.      EKG Interpretation None      MDM   Final diagnoses:  Fall  Fall down stairs, initial encounter  Dens fracture, closed, initial encounter  Thoracic compression fracture, closed, initial encounter  Radial neck fracture, left, closed, initial encounter    68 yo female with hx of  dementia presenting after a fall down approximately 10 stairs onto a hardwood floor.  No LOC.  Difficult to obtain history due to nonverbal dementia.    2:39 AM Screening CXR and PXR show no acute injuries.  Repeat abdominal exams are benign.  Unfortunately, C spine CT shows Dens fracture as well as C1 and C3 fractures.  Have paged [facility].  There is some concern about damage to the vertebral canal, so CTA ordered.    7:13 AM Further imaging revealed T3 and T5 vertebral body compression fractures.  CTA showed narrowing of her vertebral artery by approximately 50%.  Consulted trauma surgery and discussed these findings.  They will consult and make further recommendations.  They requested hospitalist admission.  Dr. Toniann FailKakrakandy will admit.    I spoke with Dr. Janee Mornhompson (Hand Surgery) regarding her left radial neck fracture.  He recommended Sling and follow up in about 2 weeks.      I personally performed the services described in this documentation, which was scribed in my presence. The recorded information has been reviewed and is accurate.     Warnell Foresterrey Toy Samarin, MD 03/15/14 [phone]

## 2014-03-15 ENCOUNTER — Emergency Department (HOSPITAL_COMMUNITY): Payer: Medicare Other

## 2014-03-15 ENCOUNTER — Encounter (HOSPITAL_COMMUNITY): Payer: Self-pay | Admitting: *Deleted

## 2014-03-15 DIAGNOSIS — M4854XA Collapsed vertebra, not elsewhere classified, thoracic region, initial encounter for fracture: Secondary | ICD-10-CM | POA: Diagnosis present

## 2014-03-15 DIAGNOSIS — G3109 Other frontotemporal dementia: Secondary | ICD-10-CM

## 2014-03-15 DIAGNOSIS — I4510 Unspecified right bundle-branch block: Secondary | ICD-10-CM | POA: Diagnosis present

## 2014-03-15 DIAGNOSIS — F0280 Dementia in other diseases classified elsewhere without behavioral disturbance: Secondary | ICD-10-CM

## 2014-03-15 DIAGNOSIS — S51012A Laceration without foreign body of left elbow, initial encounter: Secondary | ICD-10-CM | POA: Diagnosis present

## 2014-03-15 DIAGNOSIS — S12201A Unspecified nondisplaced fracture of third cervical vertebra, initial encounter for closed fracture: Secondary | ICD-10-CM | POA: Diagnosis present

## 2014-03-15 DIAGNOSIS — T50905A Adverse effect of unspecified drugs, medicaments and biological substances, initial encounter: Secondary | ICD-10-CM | POA: Diagnosis not present

## 2014-03-15 DIAGNOSIS — Z79899 Other long term (current) drug therapy: Secondary | ICD-10-CM | POA: Diagnosis not present

## 2014-03-15 DIAGNOSIS — S52133A Displaced fracture of neck of unspecified radius, initial encounter for closed fracture: Secondary | ICD-10-CM | POA: Insufficient documentation

## 2014-03-15 DIAGNOSIS — Z7401 Bed confinement status: Secondary | ICD-10-CM | POA: Diagnosis not present

## 2014-03-15 DIAGNOSIS — S12190A Other displaced fracture of second cervical vertebra, initial encounter for closed fracture: Secondary | ICD-10-CM | POA: Diagnosis present

## 2014-03-15 DIAGNOSIS — R001 Bradycardia, unspecified: Secondary | ICD-10-CM | POA: Diagnosis present

## 2014-03-15 DIAGNOSIS — S129XXA Fracture of neck, unspecified, initial encounter: Secondary | ICD-10-CM | POA: Diagnosis present

## 2014-03-15 DIAGNOSIS — W19XXXA Unspecified fall, initial encounter: Secondary | ICD-10-CM | POA: Diagnosis present

## 2014-03-15 DIAGNOSIS — W108XXA Fall (on) (from) other stairs and steps, initial encounter: Secondary | ICD-10-CM | POA: Diagnosis present

## 2014-03-15 DIAGNOSIS — Z87891 Personal history of nicotine dependence: Secondary | ICD-10-CM | POA: Diagnosis not present

## 2014-03-15 DIAGNOSIS — S12031A Nondisplaced posterior arch fracture of first cervical vertebra, initial encounter for closed fracture: Secondary | ICD-10-CM | POA: Diagnosis present

## 2014-03-15 DIAGNOSIS — S52122A Displaced fracture of head of left radius, initial encounter for closed fracture: Secondary | ICD-10-CM | POA: Diagnosis present

## 2014-03-15 DIAGNOSIS — I442 Atrioventricular block, complete: Secondary | ICD-10-CM | POA: Diagnosis present

## 2014-03-15 DIAGNOSIS — I441 Atrioventricular block, second degree: Secondary | ICD-10-CM | POA: Diagnosis present

## 2014-03-15 DIAGNOSIS — R32 Unspecified urinary incontinence: Secondary | ICD-10-CM | POA: Diagnosis present

## 2014-03-15 DIAGNOSIS — E039 Hypothyroidism, unspecified: Secondary | ICD-10-CM

## 2014-03-15 DIAGNOSIS — S12110A Anterior displaced Type II dens fracture, initial encounter for closed fracture: Secondary | ICD-10-CM | POA: Diagnosis present

## 2014-03-15 DIAGNOSIS — S22000A Wedge compression fracture of unspecified thoracic vertebra, initial encounter for closed fracture: Secondary | ICD-10-CM

## 2014-03-15 DIAGNOSIS — Y92009 Unspecified place in unspecified non-institutional (private) residence as the place of occurrence of the external cause: Secondary | ICD-10-CM | POA: Diagnosis not present

## 2014-03-15 DIAGNOSIS — S52132A Displaced fracture of neck of left radius, initial encounter for closed fracture: Secondary | ICD-10-CM

## 2014-03-15 DIAGNOSIS — E780 Pure hypercholesterolemia: Secondary | ICD-10-CM | POA: Diagnosis present

## 2014-03-15 DIAGNOSIS — I469 Cardiac arrest, cause unspecified: Secondary | ICD-10-CM | POA: Diagnosis not present

## 2014-03-15 DIAGNOSIS — J9811 Atelectasis: Secondary | ICD-10-CM | POA: Diagnosis present

## 2014-03-15 DIAGNOSIS — R159 Full incontinence of feces: Secondary | ICD-10-CM | POA: Diagnosis present

## 2014-03-15 LAB — CBC WITH DIFFERENTIAL/PLATELET
Basophils Absolute: 0 10*3/uL (ref 0.0–0.1)
Basophils Relative: 0 % (ref 0–1)
Eosinophils Absolute: 0.1 10*3/uL (ref 0.0–0.7)
Eosinophils Relative: 1 % (ref 0–5)
HCT: 41.1 % (ref 36.0–46.0)
Hemoglobin: 13.5 g/dL (ref 12.0–15.0)
LYMPHS PCT: 13 % (ref 12–46)
Lymphs Abs: 1.2 10*3/uL (ref 0.7–4.0)
MCH: 28.9 pg (ref 26.0–34.0)
MCHC: 32.8 g/dL (ref 30.0–36.0)
MCV: 88 fL (ref 78.0–100.0)
Monocytes Absolute: 0.6 10*3/uL (ref 0.1–1.0)
Monocytes Relative: 7 % (ref 3–12)
NEUTROS ABS: 7.5 10*3/uL (ref 1.7–7.7)
NEUTROS PCT: 79 % — AB (ref 43–77)
PLATELETS: 187 10*3/uL (ref 150–400)
RBC: 4.67 MIL/uL (ref 3.87–5.11)
RDW: 13 % (ref 11.5–15.5)
WBC: 9.5 10*3/uL (ref 4.0–10.5)

## 2014-03-15 LAB — URINALYSIS, ROUTINE W REFLEX MICROSCOPIC
Bilirubin Urine: NEGATIVE
GLUCOSE, UA: NEGATIVE mg/dL
Hgb urine dipstick: NEGATIVE
KETONES UR: NEGATIVE mg/dL
LEUKOCYTES UA: NEGATIVE
NITRITE: NEGATIVE
PH: 6 (ref 5.0–8.0)
Protein, ur: NEGATIVE mg/dL
SPECIFIC GRAVITY, URINE: 1.027 (ref 1.005–1.030)
Urobilinogen, UA: 0.2 mg/dL (ref 0.0–1.0)

## 2014-03-15 LAB — BASIC METABOLIC PANEL
ANION GAP: 15 (ref 5–15)
BUN: 17 mg/dL (ref 6–23)
CHLORIDE: 103 meq/L (ref 96–112)
CO2: 22 meq/L (ref 19–32)
CREATININE: 0.65 mg/dL (ref 0.50–1.10)
Calcium: 9.6 mg/dL (ref 8.4–10.5)
GFR calc non Af Amer: 90 mL/min — ABNORMAL LOW (ref >90)
Glucose, Bld: 147 mg/dL — ABNORMAL HIGH (ref 70–99)
Potassium: 4.3 mEq/L (ref 3.7–5.3)
SODIUM: 140 meq/L (ref 137–147)

## 2014-03-15 MED ORDER — ESTROGENS CONJUGATED 0.3 MG PO TABS
0.3000 mg | ORAL_TABLET | Freq: Every day | ORAL | Status: DC
  Administered 2014-03-15 – 2014-03-20 (×5): 0.3 mg via ORAL
  Filled 2014-03-15 (×6): qty 1

## 2014-03-15 MED ORDER — HYDROCODONE-ACETAMINOPHEN 5-325 MG PO TABS
1.0000 | ORAL_TABLET | ORAL | Status: DC | PRN
  Administered 2014-03-16: 1 via ORAL
  Filled 2014-03-15: qty 1

## 2014-03-15 MED ORDER — LEVOTHYROXINE SODIUM 100 MCG IV SOLR
37.5000 ug | Freq: Every day | INTRAVENOUS | Status: DC
  Administered 2014-03-15: 37.5 ug via INTRAVENOUS
  Filled 2014-03-15: qty 5

## 2014-03-15 MED ORDER — MIRTAZAPINE 15 MG PO TABS
15.0000 mg | ORAL_TABLET | Freq: Every day | ORAL | Status: DC
  Administered 2014-03-15 – 2014-03-19 (×5): 15 mg via ORAL
  Filled 2014-03-15 (×6): qty 1

## 2014-03-15 MED ORDER — MEMANTINE HCL 10 MG PO TABS
10.0000 mg | ORAL_TABLET | Freq: Two times a day (BID) | ORAL | Status: DC
  Administered 2014-03-15 – 2014-03-20 (×10): 10 mg via ORAL
  Filled 2014-03-15 (×12): qty 1

## 2014-03-15 MED ORDER — SODIUM CHLORIDE 0.9 % IJ SOLN
3.0000 mL | Freq: Two times a day (BID) | INTRAMUSCULAR | Status: DC
  Administered 2014-03-15 – 2014-03-20 (×10): 3 mL via INTRAVENOUS

## 2014-03-15 MED ORDER — MIRABEGRON ER 25 MG PO TB24
25.0000 mg | ORAL_TABLET | Freq: Every day | ORAL | Status: DC
  Administered 2014-03-15 – 2014-03-20 (×5): 25 mg via ORAL
  Filled 2014-03-15 (×6): qty 1

## 2014-03-15 MED ORDER — ACETAMINOPHEN 650 MG RE SUPP
650.0000 mg | Freq: Four times a day (QID) | RECTAL | Status: DC | PRN
Start: 2014-03-15 — End: 2014-03-20

## 2014-03-15 MED ORDER — LEVOTHYROXINE SODIUM 50 MCG PO TABS: 75.0000 ug | ORAL_TABLET | Freq: Every day | ORAL | Status: DC

## 2014-03-15 MED ORDER — IOHEXOL 350 MG/ML SOLN
50.0000 mL | Freq: Once | INTRAVENOUS | Status: AC | PRN
  Administered 2014-03-15: 50 mL via INTRAVENOUS

## 2014-03-15 MED ORDER — ACETAMINOPHEN 325 MG PO TABS
650.0000 mg | ORAL_TABLET | Freq: Once | ORAL | Status: AC
  Administered 2014-03-15: 650 mg via ORAL
  Filled 2014-03-15: qty 2

## 2014-03-15 MED ORDER — METAXALONE 400 MG HALF TABLET
400.0000 mg | ORAL_TABLET | Freq: Once | ORAL | Status: AC
  Administered 2014-03-15: 400 mg via ORAL
  Filled 2014-03-15: qty 1

## 2014-03-15 MED ORDER — HEPARIN SODIUM (PORCINE) 5000 UNIT/ML IJ SOLN
5000.0000 [IU] | Freq: Three times a day (TID) | INTRAMUSCULAR | Status: DC
  Administered 2014-03-15 – 2014-03-20 (×16): 5000 [IU] via SUBCUTANEOUS
  Filled 2014-03-15 (×15): qty 1

## 2014-03-15 MED ORDER — LEVOTHYROXINE SODIUM 50 MCG PO TABS
75.0000 ug | ORAL_TABLET | Freq: Every day | ORAL | Status: DC
  Administered 2014-03-17 – 2014-03-20 (×3): 75 ug via ORAL
  Filled 2014-03-15 (×10): qty 1

## 2014-03-15 MED ORDER — VITAMIN D 1000 UNITS PO TABS
1000.0000 [IU] | ORAL_TABLET | Freq: Every day | ORAL | Status: DC
  Administered 2014-03-15 – 2014-03-20 (×5): 1000 [IU] via ORAL
  Filled 2014-03-15 (×5): qty 1

## 2014-03-15 MED ORDER — DONEPEZIL HCL 10 MG PO TABS
10.0000 mg | ORAL_TABLET | Freq: Every day | ORAL | Status: DC
  Administered 2014-03-15 – 2014-03-16 (×2): 10 mg via ORAL
  Filled 2014-03-15 (×2): qty 1

## 2014-03-15 MED ORDER — ACETAMINOPHEN 325 MG PO TABS
650.0000 mg | ORAL_TABLET | Freq: Four times a day (QID) | ORAL | Status: DC | PRN
  Administered 2014-03-15 – 2014-03-16 (×2): 650 mg via ORAL
  Filled 2014-03-15 (×2): qty 2

## 2014-03-15 NOTE — Progress Notes (Signed)
Per patient's daughter, patient chock some times when she drink thin liquid at home. Is on nectar thick liquid now, has SPL follow up order in. Also when patient takes pills she chew all pills together and drink water after that, per daughter.  Danne HarborJohny,RN

## 2014-03-15 NOTE — ED Notes (Signed)
MD at bedside. 

## 2014-03-15 NOTE — ED Notes (Signed)
Dr Tat at bedside 

## 2014-03-15 NOTE — ED Notes (Signed)
Paged Brandi Schneider X 2

## 2014-03-15 NOTE — ED Notes (Signed)
Patient transported to CT 

## 2014-03-15 NOTE — ED Notes (Addendum)
Pt back from CT. Pt's family requesting non-narcotic pain medication for pt. EDP notified.

## 2014-03-15 NOTE — Discharge Instructions (Signed)
Radial Head Fracture A radial head fracture is a break of the radius bone in the forearm. The radial head is the part of the bone near the elbow. These breaks often happen during a fall when you land on the outstretched arm.  HOME CARE  Raise (elevate) the injured part while sitting or lying down.  Put ice on the injured area.  Put ice in a plastic bag.  Place a towel between your skin and the bag.  Leave the ice on for 15-20 minutes, 03-04 times a day.  Move your fingers.  If you have a plaster splint:  Wear the splint as told.  Loosen the elastic around the splint if your fingers become numb, tingle, or turn cold or blue.  Do not put pressure on any part of your cast or splint. Rest your cast on a pillow for the first 24 hours.  Protect your cast or splint during bathing with a plastic bag. Do not put the cast or splint in water.  Only take medicine as told by your doctor.  Follow up with your doctor. This is important.  Do not over do exercises. GET HELP RIGHT AWAY IF:   Your cast or splint gets damaged or breaks.  You have more pain or puffiness (swelling) than you did before getting the cast.  You have severe pain when stretching your fingers.  There is a bad smell, new stains or yellowish white fluid (pus) coming from under the cast.  Your fingers or hand turn pale, blue, or become cold or lose feeling (numb). MAKE SURE YOU:  Understand these instructions.  Will watch your condition.  Will get help right away if you are not doing well or get worse. Document Released: 03/25/2009 Document Revised: 06/29/2011 Document Reviewed: 03/25/2009 [facility] Patient Information 2015 [employer] [place]. This information is not intended to replace advice given to you by your health care provider. Make sure you discuss any questions you have with your health care provider.

## 2014-03-15 NOTE — H&P (Signed)
History and Physical  Brandi [name] ZOX:[id number] DOB: 1945/12/26 DOA: 03/14/2014   PCP: Dalbert MayotteSNIDER, ALISON, MD   Chief Complaint: Mechanical fall  HPI:  68 year old female with a history of hypothyroidism, fronto-temporal dementia, presented after a mechanical fall on the evening prior to admission. The patient is nonverbal at baseline, but is able to ambulate without any assistive devices at baseline. All this history is obtained from speaking with the patient's daughter at the bedside. Apparently, the patient was trying to walk up stairs when she had a mechanical fall and fell down 12-14 steps. There was no loss of consciousness. When she was found on the hardwood floor, her head was stuck underneath a metal shelf. EMS was activated, and the patient was brought to emergency. To the daughter's knowledge, Pt has not had any fever, chills, chest pain, sob, n/v/d.  She has been eating well.  At baseline, pt is incontinent of stool and urine.   In the emergency department, the patient the patient had numerous radiographic studies. CT of the chest revealed no acute intrathoracic trauma. There was left lower lobe atelectasis. CT of the abdomen and pelvis did not reveal any intra-abdominal trauma or hematoma. CT of the cervical spine revealed a comminuted C2 transverse process fracture as well as a left C3 lamina fracture and left C1 posterior arch fracture. Within the CT of the chest, the patient was also noted to have a T3 vertebral fracture as well as T5 compression fracture with 30% height loss. CT and her grandmother neck revealed focal narrowing of the left vertebral artery of 50% at the level of C2. EKG shows sinus rhythm with right bundle branch block. BMP and CBC were unremarkable. Assessment/Plan: Mechanical fall with cervical and thoracic spine fractures -CT chest revealed T3 and T5 vertebral compression fractures with 30% height loss -CT cervical spine also revealed left C3 lamina  fracture and left C1 posterior arch fracture as well as comminuted transverse process fracture of C2 -Neurosurgery, Dr. Wynetta Emeryram was consulted and will see patient -CT brain was negative for any acute intracranial abnormalities -maintain hard cervical collar -admitted to tele to r/o dysrhythmia, although telemetry can likely be discontinued after 24 hours if there is no dysrhythmia Nondisplaced left radial neck fracture -I spoke with Dr. Mack Hookavid Thompson (Hand Surgery) as arm sling could not be used due to pulling on neck -Dr. Janee Mornhompson recommended posterior long arm splint--he will place orders--He will arrange outpt followup Vertebral artery narrowing -Trauma surgery to see patient and address Fronto-temporal dementia -continue namenda and aricept Hypothyroidism -continue synthroid        Past Medical History  Diagnosis Date  . Dementia   . Hypothyroid   . Hypercholesteremia    Past Surgical History  Procedure Laterality Date  . Hysterotomy     Social History:  reports that she has quit smoking. She has never used smokeless tobacco. She reports that she does not drink alcohol or use illicit drugs.   Family History  Problem Relation Age of Onset  . Breast cancer Mother   . Stroke Father      No Known Allergies    Prior to Admission medications   Medication Sig Start Date End Date Taking? Authorizing Provider  cholecalciferol (VITAMIN D) 1000 UNITS tablet Take 1,000 Units by mouth daily.   Yes Historical Provider, MD  Coconut Oil OIL Take 45 mLs by mouth 3 (three) times daily.   Yes Historical Provider, MD  levothyroxine (SYNTHROID, LEVOTHROID) 75 MCG  tablet Take 75 mcg by mouth daily before breakfast.   Yes Historical Provider, MD  magnesium 30 MG tablet Take 30 mg by mouth 2 (two) times daily.    Yes Historical Provider, MD  memantine (NAMENDA) 10 MG tablet Take 10 mg by mouth 2 (two) times daily.   Yes Historical Provider, MD  mirabegron ER (MYRBETRIQ) 25 MG TB24 Take  25 mg by mouth daily.   Yes Historical Provider, MD  mirtazapine (REMERON) 15 MG tablet Take 15 mg by mouth at bedtime.   Yes Historical Provider, MD  OVER THE COUNTER MEDICATION Take 500 mg by mouth daily. "Tumeric"   Yes Historical Provider, MD  donepezil (ARICEPT) 10 MG tablet Take 10 mg by mouth every morning.    Historical Provider, MD  estrogens, conjugated, (PREMARIN) 0.3 MG tablet Take 0.3 mg by mouth daily.    Historical Provider, MD  MIRTAZAPINE PO Take by mouth.    Historical Provider, MD    Review of Systems:  Unobtainable secondary to patient's dementia  Physical Exam: Filed Vitals:   03/15/14 0230 03/15/14 0530 03/15/14 0600 03/15/14 0630  BP: 144/76 126/82 136/74 128/75  Pulse: 92 88 88 87  TempSrc:      Resp: 22 21 22 22   Height:      Weight:      SpO2: 94% 96% 92% 95%   General:  Alert and awake, NAD, nontoxic, pleasant/cooperative Head/Eye: No conjunctival hemorrhage, no icterus, Brandi Schneider, No nystagmus ENT:  No icterus,  No thrush, good dentition, no pharyngeal exudate Neck:  No masses, no lymphadenpathy CV:  RRR, no rub, no gallop, no S3 Lung:  CTAB, good air movement, no wheeze, no rhonchi Abdomen: soft/NT, +BS, nondistended, no peritoneal signs Ext: No cyanosis, No rashes, No petechiae, No lymphangitis, No edema  Labs on Admission:  Basic Metabolic Panel:  Recent Labs Lab 03/15/14 0050  NA 140  K 4.3  CL 103  CO2 22  GLUCOSE 147*  BUN 17  CREATININE 0.65  CALCIUM 9.6   Liver Function Tests: No results for input(s): AST, ALT, ALKPHOS, BILITOT, PROT, ALBUMIN in the last 168 hours. No results for input(s): LIPASE, AMYLASE in the last 168 hours. No results for input(s): AMMONIA in the last 168 hours. CBC:  Recent Labs Lab 03/15/14 0050  WBC 9.5  NEUTROABS 7.5  HGB 13.5  HCT 41.1  MCV 88.0  PLT 187   Cardiac Enzymes: No results for input(s): CKTOTAL, CKMB, CKMBINDEX, TROPONINI in the last 168 hours. BNP: Invalid input(s): POCBNP CBG: No  results for input(s): GLUCAP in the last 168 hours.  Radiological Exams on Admission: Dg Chest 1 View  03/15/2014   CLINICAL DATA:  Fall.  Dementia  EXAM: CHEST - 1 VIEW  COMPARISON:  None  FINDINGS: Normal heart size. There is no pleural effusion identified. Low lung volumes and asymmetric elevation of the right hemidiaphragm noted. Scar versus platelike atelectasis is identified in the left base. There is coarsened interstitial markings noted bilaterally.  IMPRESSION: 1. Left base scar versus atelectasis. 2. Diffuse, chronic appearing interstitial coarsening.   Electronically Signed   By: Signa Kell M.D.   On: 03/15/2014 01:46   Dg Pelvis 1-2 Views  03/15/2014   CLINICAL DATA:  Initial evaluation for acute trauma.  Fall.  EXAM: PELVIS - 1-2 VIEW  COMPARISON:  None.  FINDINGS: No definite fracture identified. Linear lucency traversing the left pubis symphysis is favored to be chronic in nature or perhaps related to summation of overlying soft  tissue shadows. No pelvic bone lesions are seen. SI joints are approximated. Lower lumbar spine grossly unremarkable.  IMPRESSION: 1. Age-indeterminate linear lucency traversing the left pubis symphysis. While this finding may be chronic in nature or perhaps related to summation of shadows, possible acute fracture is not excluded. Correlation with physical exam for possible fracture at this site recommended. 2. No other acute traumatic injury within the pelvis.   Electronically Signed   By: Rise Mu M.D.   On: 03/15/2014 01:51   Dg Elbow Complete Left  03/15/2014   CLINICAL DATA:  Fall.  Elbow laceration.  EXAM: LEFT ELBOW - COMPLETE 3+ VIEW  COMPARISON:  None.  FINDINGS: Cortical offset along the radial neck, only visualized in the frontal projection. Prominent anterior elbow fat pad suggesting small joint effusion. No malalignment.  IMPRESSION: Suspect a nondisplaced radial neck fracture.   Electronically Signed   By: Tiburcio Pea M.D.   On:  03/15/2014 01:49   Ct Head Wo Contrast  03/15/2014   CLINICAL DATA:  Fall.  EXAM: CT HEAD WITHOUT CONTRAST  CT CERVICAL SPINE WITHOUT CONTRAST  TECHNIQUE: Multidetector CT imaging of the head and cervical spine was performed following the standard protocol without intravenous contrast. Multiplanar CT image reconstructions of the cervical spine were also generated.  COMPARISON:  09/04/09  FINDINGS: CT HEAD FINDINGS  Prominence of the sulci and ventricles consistent with brain atrophy. Chronic right basal ganglia lacunar infarct noted. No evidence for acute intracranial hemorrhage, infarct or mass. The paranasal sinuses are clear. The mastoid air cells are clear. The calvarium appears intact.  CT CERVICAL SPINE FINDINGS  Straightening of normal cervical lordosis. The vertebral body heights are well preserved. There is multi level disc space narrowing and ventral endplate spurring compatible with degenerative disc disease. The facet joints are aligned bilaterally. There is a nondisplaced fracture involving the posterior arch of C1 on the left, image 28/series 8. There is a nondisplaced base of dens fracture which extends into the left side of the vertebral body. Comminuted fracture deformity involves the left-sided transverse process of C2 with mild posterior displacement of fracture fragments into the canal of the left vertebral artery. The left posterior arch of C3 fracture is also noted lower cervical spine appears intact.  IMPRESSION: 1. No acute intracranial abnormalities. 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 2. Base of dens fracture which extends into the left side of the vertebral body. 3. Comminuted fracture involving the left transverse process of the C2 vertebra with possible involvement of the vertebral canal 4. C3 Left lamina fracture and C1 left posterior arch fracture, nondisplaced 5. Cervical degenerative disc disease. 6. Critical Value/emergent results were called by telephone at  the time of interpretation on 03/15/2014 at 2:01 am to Dr. Warnell Forester , who verbally acknowledged these results.   Electronically Signed   By: Signa Kell M.D.   On: 03/15/2014 02:03   Ct Angio Neck W/cm &/or Wo/cm  03/15/2014   EXAM: CT ANGIOGRAPHY NECK  TECHNIQUE: Multidetector CT imaging of the neck was performed using the standard protocol during bolus administration of intravenous contrast. Multiplanar CT image reconstructions and MIPs were obtained to evaluate the vascular anatomy. Carotid stenosis measurements (when applicable) are obtained utilizing NASCET criteria, using the distal internal carotid diameter as the denominator.  CONTRAST:  50mL OMNIPAQUE IOHEXOL 350 MG/ML SOLN  COMPARISON:  Prior CT of the cervical spine performed earlier on the same day.  FINDINGS: Scattered atheromatous plaque present within the visualize aortic  arch which is of normal caliber. Aberrant right subclavian artery noted. No high-grade stenosis seen at the origin of the great vessels. Subclavian arteries well opacified bilaterally.  There is a common origin of the common carotid arteries from the aortic arch. Common carotid arteries are tortuous in medialized into the mid neck and retropharyngeal space. No hemodynamically significant stenosis or evidence of traumatic injury identified within the common carotid arteries bilaterally. Carotid bifurcations within normal limits.  Internal carotid arteries are well opacified to the skullbase without evidence of dissection, occlusion, or hemodynamically significant stenosis. Visualized portions of the circle of Willis are unremarkable.  External carotid arteries and their branches are within normal limits.  Both vertebral arteries arise from the subclavian arteries. The left vertebral artery is dominant. The right vertebral artery is well opacified along its entire course without evidence of acute traumatic injury, dissection, or stenosis.  Mild mass effect on the left V2/V3  segment as it courses by the fracture fragments at the level of the left transverse foramen. The vertebral artery delete is narrowed by approximately 50%, which may in part be related to Vasa spasm due to the adjacent fracture. This is best seen on sagittal projection (series 404, image 150). No dissection flap or pseudoaneurysm identified. Distally, the left vertebral artery is well opacified to the level of the vertebrobasilar junction.  Previously identified cervical spine fractures again noted, better characterized on prior CT. No other soft tissue abnormality within the neck.  No acute soft tissue abnormality identified within the neck. Thyroid gland within normal limits. Emphysematous changes noted within the visualized lungs.  IMPRESSION: 1. Focal narrowing of approximately 50% of the left vertebral artery at the level of the left C2 vertebral body fractures. This finding may in part be related to vasospasm due to the adjacent fracture. No frank dissection flap identified. 2. No other acute traumatic injury to the major arterial vasculature of the neck. 3. Stable cervical spine fractures, better evaluated on prior CT of the cervical spine. Results were called by telephone at the time of interpretation on 03/15/2014 at 4:16 am to Dr. Warnell ForesterREY WOFFORD , who verbally acknowledged these results.   Electronically Signed   By: Rise MuBenjamin  McClintock M.D.   On: 03/15/2014 04:17   Ct Chest W Contrast  03/15/2014   CLINICAL DATA:  Initial evaluation for acute trauma. Fall down 10 steps.  EXAM: CT CHEST, ABDOMEN, AND PELVIS WITH CONTRAST  TECHNIQUE: Multidetector CT imaging of the chest, abdomen and pelvis was performed following the standard protocol during bolus administration of intravenous contrast.  CONTRAST:  50 cc of Omni 300.  COMPARISON:  None.  FINDINGS: CT CHEST FINDINGS  Scattered shotty bilateral hilar lymph nodes are noted. No pathologically enlarged mediastinal, hilar, or axillary lymph nodes are  identified.  Intrathoracic aorta is of normal caliber and appearance. Scattered calcified plaque present within the aortic arch. No evidence for acute traumatic aortic injury. No mediastinal hematoma.  Heart size is normal.  No pericardial effusion.  Evaluation of the lungs is somewhat limited by motion artifact. Upper lobe predominant centrilobular emphysema present. Atelectatic changes seen dependently within the bilateral lower lobes. No pneumothorax. No focal infiltrates to suggest pulmonary contusion or infection. No pulmonary edema or pleural effusion.  Compression deformity at the superior endplate of T3 is suspicious for possible acute fracture (series 204, image 71). There is approximately 30% of height loss without significant retropulsion. There is an additional compression deformity of the T5 vertebral body with approximately 20-30% of  height loss with a retropulsion, also suspicious for acute fracture. No other acute fracture or other osseous abnormality within the thorax.  CT ABDOMEN AND PELVIS FINDINGS  Scattered subcentimeter hypodensities within the spleen are noted, too small the characterize by CT, but may reflect small cyst. A 9 mm hyperdense lesion within the left hepatic lobe is noted, indeterminate, but likely a small benign hemangioma. Liver is otherwise unremarkable. Gallbladder within normal limits. No biliary dilatation. Spleen is intact. No perisplenic hematoma. Adrenal glands and pancreas are within normal limits.  Kidneys are equal in size with symmetric enhancement. No nephrolithiasis, hydronephrosis, or focal enhancing renal mass.  Stomach within normal limits. No evidence for bowel obstruction or acute bowel injury. No abnormal wall thickening, mucosal enhancement, or inflammatory fat stranding seen about the bowels. Prominent fecal impaction present within the rectal vault.  Bladder intact an within normal limits. Uterus and ovaries are not visualized.  No adenopathy. No free air or  fluid. Mild mesenteric edema noted centrally within the abdomen, of uncertain clinical significance.  Moderate atheromatous plaque present within the infrarenal aorta without associated aneurysm. No contrast extravasation. No mesenteric or retroperitoneal hematoma.  No acute fracture within the pelvis. No spinal fracture within the abdomen and pelvis. Degenerative changes noted about the L4-5 intervertebral disc space.  IMPRESSION: 1. Compression deformities of the T3 and T5 vertebral bodies with associated 20-30% of height loss as above, suspicious for acute compression fractures. No significant retropulsion. 2. No other acute traumatic injury within the chest, abdomen, and pelvis. 3. Prominent fecal impaction within the rectal vault without associated obstruction.   Electronically Signed   By: Rise Mu M.D.   On: 03/15/2014 05:38   Ct Cervical Spine Wo Contrast  03/15/2014   CLINICAL DATA:  Fall.  EXAM: CT HEAD WITHOUT CONTRAST  CT CERVICAL SPINE WITHOUT CONTRAST  TECHNIQUE: Multidetector CT imaging of the head and cervical spine was performed following the standard protocol without intravenous contrast. Multiplanar CT image reconstructions of the cervical spine were also generated.  COMPARISON:  09/04/09  FINDINGS: CT HEAD FINDINGS  Prominence of the sulci and ventricles consistent with brain atrophy. Chronic right basal ganglia lacunar infarct noted. No evidence for acute intracranial hemorrhage, infarct or mass. The paranasal sinuses are clear. The mastoid air cells are clear. The calvarium appears intact.  CT CERVICAL SPINE FINDINGS  Straightening of normal cervical lordosis. The vertebral body heights are well preserved. There is multi level disc space narrowing and ventral endplate spurring compatible with degenerative disc disease. The facet joints are aligned bilaterally. There is a nondisplaced fracture involving the posterior arch of C1 on the left, image 28/series 8. There is a  nondisplaced base of dens fracture which extends into the left side of the vertebral body. Comminuted fracture deformity involves the left-sided transverse process of C2 with mild posterior displacement of fracture fragments into the canal of the left vertebral artery. The left posterior arch of C3 fracture is also noted lower cervical spine appears intact.  IMPRESSION: 1. No acute intracranial abnormalities. 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 2. Base of dens fracture which extends into the left side of the vertebral body. 3. Comminuted fracture involving the left transverse process of the C2 vertebra with possible involvement of the vertebral canal 4. C3 Left lamina fracture and C1 left posterior arch fracture, nondisplaced 5. Cervical degenerative disc disease. 6. Critical Value/emergent results were called by telephone at the time of interpretation on 03/15/2014 at 2:01 am to Dr.  TREY WOFFORD , who verbally acknowledged these results.   Electronically Signed   By: Signa Kell M.D.   On: 03/15/2014 02:03   Ct Abdomen Pelvis W Contrast  03/15/2014   CLINICAL DATA:  Initial evaluation for acute trauma. Fall down 10 steps.  EXAM: CT CHEST, ABDOMEN, AND PELVIS WITH CONTRAST  TECHNIQUE: Multidetector CT imaging of the chest, abdomen and pelvis was performed following the standard protocol during bolus administration of intravenous contrast.  CONTRAST:  50 cc of Omni 300.  COMPARISON:  None.  FINDINGS: CT CHEST FINDINGS  Scattered shotty bilateral hilar lymph nodes are noted. No pathologically enlarged mediastinal, hilar, or axillary lymph nodes are identified.  Intrathoracic aorta is of normal caliber and appearance. Scattered calcified plaque present within the aortic arch. No evidence for acute traumatic aortic injury. No mediastinal hematoma.  Heart size is normal.  No pericardial effusion.  Evaluation of the lungs is somewhat limited by motion artifact. Upper lobe predominant  centrilobular emphysema present. Atelectatic changes seen dependently within the bilateral lower lobes. No pneumothorax. No focal infiltrates to suggest pulmonary contusion or infection. No pulmonary edema or pleural effusion.  Compression deformity at the superior endplate of T3 is suspicious for possible acute fracture (series 204, image 71). There is approximately 30% of height loss without significant retropulsion. There is an additional compression deformity of the T5 vertebral body with approximately 20-30% of height loss with a retropulsion, also suspicious for acute fracture. No other acute fracture or other osseous abnormality within the thorax.  CT ABDOMEN AND PELVIS FINDINGS  Scattered subcentimeter hypodensities within the spleen are noted, too small the characterize by CT, but may reflect small cyst. A 9 mm hyperdense lesion within the left hepatic lobe is noted, indeterminate, but likely a small benign hemangioma. Liver is otherwise unremarkable. Gallbladder within normal limits. No biliary dilatation. Spleen is intact. No perisplenic hematoma. Adrenal glands and pancreas are within normal limits.  Kidneys are equal in size with symmetric enhancement. No nephrolithiasis, hydronephrosis, or focal enhancing renal mass.  Stomach within normal limits. No evidence for bowel obstruction or acute bowel injury. No abnormal wall thickening, mucosal enhancement, or inflammatory fat stranding seen about the bowels. Prominent fecal impaction present within the rectal vault.  Bladder intact an within normal limits. Uterus and ovaries are not visualized.  No adenopathy. No free air or fluid. Mild mesenteric edema noted centrally within the abdomen, of uncertain clinical significance.  Moderate atheromatous plaque present within the infrarenal aorta without associated aneurysm. No contrast extravasation. No mesenteric or retroperitoneal hematoma.  No acute fracture within the pelvis. No spinal fracture within the  abdomen and pelvis. Degenerative changes noted about the L4-5 intervertebral disc space.  IMPRESSION: 1. Compression deformities of the T3 and T5 vertebral bodies with associated 20-30% of height loss as above, suspicious for acute compression fractures. No significant retropulsion. 2. No other acute traumatic injury within the chest, abdomen, and pelvis. 3. Prominent fecal impaction within the rectal vault without associated obstruction.   Electronically Signed   By: Rise Mu M.D.   On: 03/15/2014 05:38    EKG: Independently reviewed. Sinus rhythm, right bundle branch block, unchanged versus February 2014    Time spent:60 minutes Code Status:   FULL Family Communication:   Daughter updated at bedside   Brandi Delany, DO  [facility] Pager [phone]  If 7PM-7AM, please contact night-coverage [email or web address] Password [email or web address] 03/15/2014, 8:00 AM

## 2014-03-15 NOTE — ED Notes (Signed)
Pt back from CT

## 2014-03-15 NOTE — Consult Note (Signed)
ORTHOPAEDIC CONSULTATION HISTORY & PHYSICAL REQUESTING PHYSICIAN: Catarina Hartshornavid Tat, MD  Chief Complaint: left elbow injury  HPI: Brandi Schneider is a 68 y.o. female with dementia who had unwitnessed fall at home, admitted to hospital with cervical spine fractures and left elbow injury.  She is non-verbal due to her dementia.  Past Medical History  Diagnosis Date  . Dementia   . Hypothyroid   . Hypercholesteremia    Past Surgical History  Procedure Laterality Date  . Hysterotomy     History   Social History  . Marital Status: Married    Spouse Name: N/A    Number of Children: N/A  . Years of Education: N/A   Social History Main Topics  . Smoking status: Former Games developermoker  . Smokeless tobacco: Never Used  . Alcohol Use: No  . Drug Use: No  . Sexual Activity: None   Other Topics Concern  . None   Social History Narrative   Family History  Problem Relation Age of Onset  . Breast cancer Mother   . Stroke Father    No Known Allergies Prior to Admission medications   Medication Sig Start Date End Date Taking? Authorizing Provider  cholecalciferol (VITAMIN D) 1000 UNITS tablet Take 1,000 Units by mouth daily.   Yes Historical Provider, MD  Coconut Oil OIL Take 45 mLs by mouth 3 (three) times daily.   Yes Historical Provider, MD  levothyroxine (SYNTHROID, LEVOTHROID) 75 MCG tablet Take 75 mcg by mouth daily before breakfast.   Yes Historical Provider, MD  magnesium 30 MG tablet Take 30 mg by mouth 2 (two) times daily.    Yes Historical Provider, MD  memantine (NAMENDA) 10 MG tablet Take 10 mg by mouth 2 (two) times daily.   Yes Historical Provider, MD  mirabegron ER (MYRBETRIQ) 25 MG TB24 Take 25 mg by mouth daily.   Yes Historical Provider, MD  mirtazapine (REMERON) 15 MG tablet Take 15 mg by mouth at bedtime.   Yes Historical Provider, MD  OVER THE COUNTER MEDICATION Take 500 mg by mouth daily. "Tumeric"   Yes Historical Provider, MD  donepezil (ARICEPT) 10 MG tablet Take 10  mg by mouth every morning.    Historical Provider, MD  estrogens, conjugated, (PREMARIN) 0.3 MG tablet Take 0.3 mg by mouth daily.    Historical Provider, MD  MIRTAZAPINE PO Take by mouth.    Historical Provider, MD   Dg Chest 1 View  03/15/2014   CLINICAL DATA:  Fall.  Dementia  EXAM: CHEST - 1 VIEW  COMPARISON:  None  FINDINGS: Normal heart size. There is no pleural effusion identified. Low lung volumes and asymmetric elevation of the right hemidiaphragm noted. Scar versus platelike atelectasis is identified in the left base. There is coarsened interstitial markings noted bilaterally.  IMPRESSION: 1. Left base scar versus atelectasis. 2. Diffuse, chronic appearing interstitial coarsening.   Electronically Signed   By: Signa Kellaylor  Stroud M.D.   On: 03/15/2014 01:46   Dg Pelvis 1-2 Views  03/15/2014   CLINICAL DATA:  Initial evaluation for acute trauma.  Fall.  EXAM: PELVIS - 1-2 VIEW  COMPARISON:  None.  FINDINGS: No definite fracture identified. Linear lucency traversing the left pubis symphysis is favored to be chronic in nature or perhaps related to summation of overlying soft tissue shadows. No pelvic bone lesions are seen. SI joints are approximated. Lower lumbar spine grossly unremarkable.  IMPRESSION: 1. Age-indeterminate linear lucency traversing the left pubis symphysis. While this finding may be chronic in  nature or perhaps related to summation of shadows, possible acute fracture is not excluded. Correlation with physical exam for possible fracture at this site recommended. 2. No other acute traumatic injury within the pelvis.   Electronically Signed   By: Rise Mu M.D.   On: 03/15/2014 01:51   Dg Elbow Complete Left  03/15/2014   CLINICAL DATA:  Fall.  Elbow laceration.  EXAM: LEFT ELBOW - COMPLETE 3+ VIEW  COMPARISON:  None.  FINDINGS: Cortical offset along the radial neck, only visualized in the frontal projection. Prominent anterior elbow fat pad suggesting small joint effusion.  No malalignment.  IMPRESSION: Suspect a nondisplaced radial neck fracture.   Electronically Signed   By: Tiburcio Pea M.D.   On: 03/15/2014 01:49   Ct Head Wo Contrast  03/15/2014   CLINICAL DATA:  Fall.  EXAM: CT HEAD WITHOUT CONTRAST  CT CERVICAL SPINE WITHOUT CONTRAST  TECHNIQUE: Multidetector CT imaging of the head and cervical spine was performed following the standard protocol without intravenous contrast. Multiplanar CT image reconstructions of the cervical spine were also generated.  COMPARISON:  09/04/09  FINDINGS: CT HEAD FINDINGS  Prominence of the sulci and ventricles consistent with brain atrophy. Chronic right basal ganglia lacunar infarct noted. No evidence for acute intracranial hemorrhage, infarct or mass. The paranasal sinuses are clear. The mastoid air cells are clear. The calvarium appears intact.  CT CERVICAL SPINE FINDINGS  Straightening of normal cervical lordosis. The vertebral body heights are well preserved. There is multi level disc space narrowing and ventral endplate spurring compatible with degenerative disc disease. The facet joints are aligned bilaterally. There is a nondisplaced fracture involving the posterior arch of C1 on the left, image 28/series 8. There is a nondisplaced base of dens fracture which extends into the left side of the vertebral body. Comminuted fracture deformity involves the left-sided transverse process of C2 with mild posterior displacement of fracture fragments into the canal of the left vertebral artery. The left posterior arch of C3 fracture is also noted lower cervical spine appears intact.  IMPRESSION: 1. No acute intracranial abnormalities. 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 2. Base of dens fracture which extends into the left side of the vertebral body. 3. Comminuted fracture involving the left transverse process of the C2 vertebra with possible involvement of the vertebral canal 4. C3 Left lamina fracture and C1 left  posterior arch fracture, nondisplaced 5. Cervical degenerative disc disease. 6. Critical Value/emergent results were called by telephone at the time of interpretation on 03/15/2014 at 2:01 am to Dr. Warnell Forester , who verbally acknowledged these results.   Electronically Signed   By: Signa Kell M.D.   On: 03/15/2014 02:03   Ct Angio Neck W/cm &/or Wo/cm  03/15/2014   EXAM: CT ANGIOGRAPHY NECK  TECHNIQUE: Multidetector CT imaging of the neck was performed using the standard protocol during bolus administration of intravenous contrast. Multiplanar CT image reconstructions and MIPs were obtained to evaluate the vascular anatomy. Carotid stenosis measurements (when applicable) are obtained utilizing NASCET criteria, using the distal internal carotid diameter as the denominator.  CONTRAST:  50mL OMNIPAQUE IOHEXOL 350 MG/ML SOLN  COMPARISON:  Prior CT of the cervical spine performed earlier on the same day.  FINDINGS: Scattered atheromatous plaque present within the visualize aortic arch which is of normal caliber. Aberrant right subclavian artery noted. No high-grade stenosis seen at the origin of the great vessels. Subclavian arteries well opacified bilaterally.  There is a common origin of the  common carotid arteries from the aortic arch. Common carotid arteries are tortuous in medialized into the mid neck and retropharyngeal space. No hemodynamically significant stenosis or evidence of traumatic injury identified within the common carotid arteries bilaterally. Carotid bifurcations within normal limits.  Internal carotid arteries are well opacified to the skullbase without evidence of dissection, occlusion, or hemodynamically significant stenosis. Visualized portions of the circle of Willis are unremarkable.  External carotid arteries and their branches are within normal limits.  Both vertebral arteries arise from the subclavian arteries. The left vertebral artery is dominant. The right vertebral artery is well  opacified along its entire course without evidence of acute traumatic injury, dissection, or stenosis.  Mild mass effect on the left V2/V3 segment as it courses by the fracture fragments at the level of the left transverse foramen. The vertebral artery delete is narrowed by approximately 50%, which may in part be related to Vasa spasm due to the adjacent fracture. This is best seen on sagittal projection (series 404, image 150). No dissection flap or pseudoaneurysm identified. Distally, the left vertebral artery is well opacified to the level of the vertebrobasilar junction.  Previously identified cervical spine fractures again noted, better characterized on prior CT. No other soft tissue abnormality within the neck.  No acute soft tissue abnormality identified within the neck. Thyroid gland within normal limits. Emphysematous changes noted within the visualized lungs.  IMPRESSION: 1. Focal narrowing of approximately 50% of the left vertebral artery at the level of the left C2 vertebral body fractures. This finding may in part be related to vasospasm due to the adjacent fracture. No frank dissection flap identified. 2. No other acute traumatic injury to the major arterial vasculature of the neck. 3. Stable cervical spine fractures, better evaluated on prior CT of the cervical spine. Results were called by telephone at the time of interpretation on 03/15/2014 at 4:16 am to Dr. Warnell Forester , who verbally acknowledged these results.   Electronically Signed   By: Rise Mu M.D.   On: 03/15/2014 04:17   Ct Chest W Contrast  03/15/2014   CLINICAL DATA:  Initial evaluation for acute trauma. Fall down 10 steps.  EXAM: CT CHEST, ABDOMEN, AND PELVIS WITH CONTRAST  TECHNIQUE: Multidetector CT imaging of the chest, abdomen and pelvis was performed following the standard protocol during bolus administration of intravenous contrast.  CONTRAST:  50 cc of Omni 300.  COMPARISON:  None.  FINDINGS: CT CHEST FINDINGS   Scattered shotty bilateral hilar lymph nodes are noted. No pathologically enlarged mediastinal, hilar, or axillary lymph nodes are identified.  Intrathoracic aorta is of normal caliber and appearance. Scattered calcified plaque present within the aortic arch. No evidence for acute traumatic aortic injury. No mediastinal hematoma.  Heart size is normal.  No pericardial effusion.  Evaluation of the lungs is somewhat limited by motion artifact. Upper lobe predominant centrilobular emphysema present. Atelectatic changes seen dependently within the bilateral lower lobes. No pneumothorax. No focal infiltrates to suggest pulmonary contusion or infection. No pulmonary edema or pleural effusion.  Compression deformity at the superior endplate of T3 is suspicious for possible acute fracture (series 204, image 71). There is approximately 30% of height loss without significant retropulsion. There is an additional compression deformity of the T5 vertebral body with approximately 20-30% of height loss with a retropulsion, also suspicious for acute fracture. No other acute fracture or other osseous abnormality within the thorax.  CT ABDOMEN AND PELVIS FINDINGS  Scattered subcentimeter hypodensities within the spleen are  noted, too small the characterize by CT, but may reflect small cyst. A 9 mm hyperdense lesion within the left hepatic lobe is noted, indeterminate, but likely a small benign hemangioma. Liver is otherwise unremarkable. Gallbladder within normal limits. No biliary dilatation. Spleen is intact. No perisplenic hematoma. Adrenal glands and pancreas are within normal limits.  Kidneys are equal in size with symmetric enhancement. No nephrolithiasis, hydronephrosis, or focal enhancing renal mass.  Stomach within normal limits. No evidence for bowel obstruction or acute bowel injury. No abnormal wall thickening, mucosal enhancement, or inflammatory fat stranding seen about the bowels. Prominent fecal impaction present  within the rectal vault.  Bladder intact an within normal limits. Uterus and ovaries are not visualized.  No adenopathy. No free air or fluid. Mild mesenteric edema noted centrally within the abdomen, of uncertain clinical significance.  Moderate atheromatous plaque present within the infrarenal aorta without associated aneurysm. No contrast extravasation. No mesenteric or retroperitoneal hematoma.  No acute fracture within the pelvis. No spinal fracture within the abdomen and pelvis. Degenerative changes noted about the L4-5 intervertebral disc space.  IMPRESSION: 1. Compression deformities of the T3 and T5 vertebral bodies with associated 20-30% of height loss as above, suspicious for acute compression fractures. No significant retropulsion. 2. No other acute traumatic injury within the chest, abdomen, and pelvis. 3. Prominent fecal impaction within the rectal vault without associated obstruction.   Electronically Signed   By: Rise MuBenjamin  McClintock M.D.   On: 03/15/2014 05:38   Ct Cervical Spine Wo Contrast  03/15/2014   CLINICAL DATA:  Fall.  EXAM: CT HEAD WITHOUT CONTRAST  CT CERVICAL SPINE WITHOUT CONTRAST  TECHNIQUE: Multidetector CT imaging of the head and cervical spine was performed following the standard protocol without intravenous contrast. Multiplanar CT image reconstructions of the cervical spine were also generated.  COMPARISON:  09/04/09  FINDINGS: CT HEAD FINDINGS  Prominence of the sulci and ventricles consistent with brain atrophy. Chronic right basal ganglia lacunar infarct noted. No evidence for acute intracranial hemorrhage, infarct or mass. The paranasal sinuses are clear. The mastoid air cells are clear. The calvarium appears intact.  CT CERVICAL SPINE FINDINGS  Straightening of normal cervical lordosis. The vertebral body heights are well preserved. There is multi level disc space narrowing and ventral endplate spurring compatible with degenerative disc disease. The facet joints are  aligned bilaterally. There is a nondisplaced fracture involving the posterior arch of C1 on the left, image 28/series 8. There is a nondisplaced base of dens fracture which extends into the left side of the vertebral body. Comminuted fracture deformity involves the left-sided transverse process of C2 with mild posterior displacement of fracture fragments into the canal of the left vertebral artery. The left posterior arch of C3 fracture is also noted lower cervical spine appears intact.  IMPRESSION: 1. No acute intracranial abnormalities. 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 2. Base of dens fracture which extends into the left side of the vertebral body. 3. Comminuted fracture involving the left transverse process of the C2 vertebra with possible involvement of the vertebral canal 4. C3 Left lamina fracture and C1 left posterior arch fracture, nondisplaced 5. Cervical degenerative disc disease. 6. Critical Value/emergent results were called by telephone at the time of interpretation on 03/15/2014 at 2:01 am to Dr. Warnell ForesterREY WOFFORD , who verbally acknowledged these results.   Electronically Signed   By: Signa Kellaylor  Stroud M.D.   On: 03/15/2014 02:03   Ct Abdomen Pelvis W Contrast  03/15/2014  CLINICAL DATA:  Initial evaluation for acute trauma. Fall down 10 steps.  EXAM: CT CHEST, ABDOMEN, AND PELVIS WITH CONTRAST  TECHNIQUE: Multidetector CT imaging of the chest, abdomen and pelvis was performed following the standard protocol during bolus administration of intravenous contrast.  CONTRAST:  50 cc of Omni 300.  COMPARISON:  None.  FINDINGS: CT CHEST FINDINGS  Scattered shotty bilateral hilar lymph nodes are noted. No pathologically enlarged mediastinal, hilar, or axillary lymph nodes are identified.  Intrathoracic aorta is of normal caliber and appearance. Scattered calcified plaque present within the aortic arch. No evidence for acute traumatic aortic injury. No mediastinal hematoma.  Heart size is  normal.  No pericardial effusion.  Evaluation of the lungs is somewhat limited by motion artifact. Upper lobe predominant centrilobular emphysema present. Atelectatic changes seen dependently within the bilateral lower lobes. No pneumothorax. No focal infiltrates to suggest pulmonary contusion or infection. No pulmonary edema or pleural effusion.  Compression deformity at the superior endplate of T3 is suspicious for possible acute fracture (series 204, image 71). There is approximately 30% of height loss without significant retropulsion. There is an additional compression deformity of the T5 vertebral body with approximately 20-30% of height loss with a retropulsion, also suspicious for acute fracture. No other acute fracture or other osseous abnormality within the thorax.  CT ABDOMEN AND PELVIS FINDINGS  Scattered subcentimeter hypodensities within the spleen are noted, too small the characterize by CT, but may reflect small cyst. A 9 mm hyperdense lesion within the left hepatic lobe is noted, indeterminate, but likely a small benign hemangioma. Liver is otherwise unremarkable. Gallbladder within normal limits. No biliary dilatation. Spleen is intact. No perisplenic hematoma. Adrenal glands and pancreas are within normal limits.  Kidneys are equal in size with symmetric enhancement. No nephrolithiasis, hydronephrosis, or focal enhancing renal mass.  Stomach within normal limits. No evidence for bowel obstruction or acute bowel injury. No abnormal wall thickening, mucosal enhancement, or inflammatory fat stranding seen about the bowels. Prominent fecal impaction present within the rectal vault.  Bladder intact an within normal limits. Uterus and ovaries are not visualized.  No adenopathy. No free air or fluid. Mild mesenteric edema noted centrally within the abdomen, of uncertain clinical significance.  Moderate atheromatous plaque present within the infrarenal aorta without associated aneurysm. No contrast  extravasation. No mesenteric or retroperitoneal hematoma.  No acute fracture within the pelvis. No spinal fracture within the abdomen and pelvis. Degenerative changes noted about the L4-5 intervertebral disc space.  IMPRESSION: 1. Compression deformities of the T3 and T5 vertebral bodies with associated 20-30% of height loss as above, suspicious for acute compression fractures. No significant retropulsion. 2. No other acute traumatic injury within the chest, abdomen, and pelvis. 3. Prominent fecal impaction within the rectal vault without associated obstruction.   Electronically Signed   By: Rise Mu M.D.   On: 03/15/2014 05:38    Positive ROS: All other systems have been reviewed and were otherwise negative with the exception of those mentioned in the HPI and as above.  Physical Exam: Vitals: Refer to EMR. Constitutional:  WD, WN, NAD HEENT:  c-collar in place Neuro/Psych:  Non-verbal, does not follow my commands.  Awake and alert however. Lymphatic: No generalized extremity edema or lymphadenopathy Extremities / MSK:  The extremities are normal with respect to appearance, ranges of motion, joint stability, muscle strength/tone, sensation, & perfusion except as otherwise noted:  B UE with significant atrophy of hand intrinsics, including thenar muscles with resultant claw posture.  Dressing on posterior elbow on left L elbow held at 90 degrees, mild pain response provoked by firm palpation of proximal radius and passive pro/sup  Assessment: L radial head ND fracture  Plan: I don't think she would do well with a sling or a splint, and given the ND nature of the fx and her seemingly low-level of discomfort, I think it OK to leave out of sling or splint F/u 2 weeks for re-check  Onalee Hua A. Janee Morn, MD      Orthopaedic & Hand Surgery [facility] [address] [place], [place]  [address] Office: [phone] Mobile: [phone]

## 2014-03-15 NOTE — ED Notes (Signed)
Patient is resting comfortably. 

## 2014-03-15 NOTE — Progress Notes (Signed)
MD paged because pt was having muscle spasms.  MD ordered new medicine.  Will continue to monitor.  Estanislado Emmsshley Schwarz, RN 03/15/2014 8:49 PM

## 2014-03-15 NOTE — Consult Note (Signed)
Reason for Consult: Cervical and thoracic spine fractures Referring Physician: EDP  Brandi Schneider is an 68 y.o. female.   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 Unknown whether she had a loss of consciousness. Presented to the emergency department where CT scan of the cervical spine showed a C1-C2 and C3 fracture and CT scan of the chest showed T3 and T5 mild compression fractures. Neurosurgical evaluation was requested. Because of her frontotemporal dementia she is nonverbal. She cannot answer questions and cannot give history. History is taken from her daughter who is her caregiver.  Past Medical History  Diagnosis Date  . Dementia   . Hypothyroid   . Hypercholesteremia     Past Surgical History  Procedure Laterality Date  . Hysterotomy      No Known Allergies  History  Substance Use Topics  . Smoking status: Former Games developer  . Smokeless tobacco: Never Used  . Alcohol Use: No    Family History  Problem Relation Age of Onset  . Breast cancer Mother   . Stroke Father      Review of Systems  Positive ROS: Negative  All other systems have been reviewed and were otherwise negative with the exception of those mentioned in the HPI and as above.  Objective: Vital signs in last 24 hours: Temp:  [98.6 F (37 C)] 98.6 F (37 C) (11/26 0900) Pulse Rate:  [80-92] 80 (11/26 0900) Resp:  [20-26] 22 (11/26 0900) BP: (104-144)/(60-85) 115/82 mmHg (11/26 0900) SpO2:  [89 %-97 %] 97 % (11/26 0900) Weight:  [160 lb (72.576 kg)] 160 lb (72.576 kg) (11/26 0004)  General Appearance: Alert, occasionally moans somewhat but appears to be in no significant distress, appears stated age Head: Normocephalic, without obvious abnormality, atraumatic Eyes: PERRL, conjunctiva/corneas clear, gaze conjugate      Neck: Supple, symmetrical, trachea midline, in cervical collar Lungs:   respirations unlabored Heart: Regular rate and rhythm Extremities: Chronic atrophy and clawlike deformity of her hands bilaterally NEUROLOGIC:   Mental status: Alert but nonverbal, regards the examiner somewhat Motor Exam - seems to move all extremities, has chronic atrophy and clawlike deformity of the hands Sensory Exam - unable to test adequately Gait not tested Cranial Nerves: I: smell Not tested  II: visual acuity  OS: na    OD: na  II: visual fields   II: pupils Equal, round, reactive to light  III,VII: ptosis None  III,IV,VI: extraocular muscles  Full ROM  V: mastication Normal  V: facial light touch sensation    V,VII: corneal reflex  Present  VII: facial muscle function - upper  Normal  VII: facial muscle function - lower Normal  VIII: hearing Not tested  IX: soft palate elevation    IX,X: gag reflex Present  XI: trapezius strength    XI: sternocleidomastoid strength   XI: neck flexion strength    XII: tongue strength      Data Review Lab Results  Component Value Date   WBC 9.5 03/15/2014   HGB 13.5 03/15/2014   HCT 41.1 03/15/2014   MCV 88.0 03/15/2014   PLT 187 03/15/2014   Lab Results  Component Value Date   NA 140 03/15/2014   K 4.3 03/15/2014   CL 103 03/15/2014   CO2 22 03/15/2014   BUN 17 03/15/2014   CREATININE 0.65 03/15/2014   GLUCOSE 147* 03/15/2014   No results found for:  INR, PROTIME  Radiology: Dg Chest 1 View  03/15/2014   CLINICAL DATA:  Fall.  Dementia  EXAM: CHEST - 1 VIEW  COMPARISON:  None  FINDINGS: Normal heart size. There is no pleural effusion identified. Low lung volumes and asymmetric elevation of the right hemidiaphragm noted. Scar versus platelike atelectasis is identified in the left base. There is coarsened interstitial markings noted bilaterally.  IMPRESSION: 1. Left base scar versus atelectasis. 2. Diffuse, chronic appearing interstitial coarsening.   Electronically Signed   By: Signa Kell M.D.   On: 03/15/2014 01:46    Dg Pelvis 1-2 Views  03/15/2014   CLINICAL DATA:  Initial evaluation for acute trauma.  Fall.  EXAM: PELVIS - 1-2 VIEW  COMPARISON:  None.  FINDINGS: No definite fracture identified. Linear lucency traversing the left pubis symphysis is favored to be chronic in nature or perhaps related to summation of overlying soft tissue shadows. No pelvic bone lesions are seen. SI joints are approximated. Lower lumbar spine grossly unremarkable.  IMPRESSION: 1. Age-indeterminate linear lucency traversing the left pubis symphysis. While this finding may be chronic in nature or perhaps related to summation of shadows, possible acute fracture is not excluded. Correlation with physical exam for possible fracture at this site recommended. 2. No other acute traumatic injury within the pelvis.   Electronically Signed   By: Rise Mu M.D.   On: 03/15/2014 01:51   Dg Elbow Complete Left  03/15/2014   CLINICAL DATA:  Fall.  Elbow laceration.  EXAM: LEFT ELBOW - COMPLETE 3+ VIEW  COMPARISON:  None.  FINDINGS: Cortical offset along the radial neck, only visualized in the frontal projection. Prominent anterior elbow fat pad suggesting small joint effusion. No malalignment.  IMPRESSION: Suspect a nondisplaced radial neck fracture.   Electronically Signed   By: Tiburcio Pea M.D.   On: 03/15/2014 01:49   Ct Head Wo Contrast  03/15/2014   CLINICAL DATA:  Fall.  EXAM: CT HEAD WITHOUT CONTRAST  CT CERVICAL SPINE WITHOUT CONTRAST  TECHNIQUE: Multidetector CT imaging of the head and cervical spine was performed following the standard protocol without intravenous contrast. Multiplanar CT image reconstructions of the cervical spine were also generated.  COMPARISON:  09/04/09  FINDINGS: CT HEAD FINDINGS  Prominence of the sulci and ventricles consistent with brain atrophy. Chronic right basal ganglia lacunar infarct noted. No evidence for acute intracranial hemorrhage, infarct or mass. The paranasal sinuses are clear. The  mastoid air cells are clear. The calvarium appears intact.  CT CERVICAL SPINE FINDINGS  Straightening of normal cervical lordosis. The vertebral body heights are well preserved. There is multi level disc space narrowing and ventral endplate spurring compatible with degenerative disc disease. The facet joints are aligned bilaterally. There is a nondisplaced fracture involving the posterior arch of C1 on the left, image 28/series 8. There is a nondisplaced base of dens fracture which extends into the left side of the vertebral body. Comminuted fracture deformity involves the left-sided transverse process of C2 with mild posterior displacement of fracture fragments into the canal of the left vertebral artery. The left posterior arch of C3 fracture is also noted lower cervical spine appears intact.  IMPRESSION: 1. No acute intracranial abnormalities. 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 2. Base of dens fracture which extends into the left side of the vertebral body. 3. Comminuted fracture involving the left transverse process of the C2 vertebra with possible involvement of the vertebral canal 4. C3 Left lamina fracture and C1 left  posterior arch fracture, nondisplaced 5. Cervical degenerative disc disease. 6. Critical Value/emergent results were called by telephone at the time of interpretation on 03/15/2014 at 2:01 am to Dr. Warnell ForesterREY WOFFORD , who verbally acknowledged these results.   Electronically Signed   By: Signa Kellaylor  Stroud M.D.   On: 03/15/2014 02:03   Ct Angio Neck W/cm &/or Wo/cm  03/15/2014   EXAM: CT ANGIOGRAPHY NECK  TECHNIQUE: Multidetector CT imaging of the neck was performed using the standard protocol during bolus administration of intravenous contrast. Multiplanar CT image reconstructions and MIPs were obtained to evaluate the vascular anatomy. Carotid stenosis measurements (when applicable) are obtained utilizing NASCET criteria, using the distal internal carotid diameter as the  denominator.  CONTRAST:  50mL OMNIPAQUE IOHEXOL 350 MG/ML SOLN  COMPARISON:  Prior CT of the cervical spine performed earlier on the same day.  FINDINGS: Scattered atheromatous plaque present within the visualize aortic arch which is of normal caliber. Aberrant right subclavian artery noted. No high-grade stenosis seen at the origin of the great vessels. Subclavian arteries well opacified bilaterally.  There is a common origin of the common carotid arteries from the aortic arch. Common carotid arteries are tortuous in medialized into the mid neck and retropharyngeal space. No hemodynamically significant stenosis or evidence of traumatic injury identified within the common carotid arteries bilaterally. Carotid bifurcations within normal limits.  Internal carotid arteries are well opacified to the skullbase without evidence of dissection, occlusion, or hemodynamically significant stenosis. Visualized portions of the circle of Willis are unremarkable.  External carotid arteries and their branches are within normal limits.  Both vertebral arteries arise from the subclavian arteries. The left vertebral artery is dominant. The right vertebral artery is well opacified along its entire course without evidence of acute traumatic injury, dissection, or stenosis.  Mild mass effect on the left V2/V3 segment as it courses by the fracture fragments at the level of the left transverse foramen. The vertebral artery delete is narrowed by approximately 50%, which may in part be related to Vasa spasm due to the adjacent fracture. This is best seen on sagittal projection (series 404, image 150). No dissection flap or pseudoaneurysm identified. Distally, the left vertebral artery is well opacified to the level of the vertebrobasilar junction.  Previously identified cervical spine fractures again noted, better characterized on prior CT. No other soft tissue abnormality within the neck.  No acute soft tissue abnormality identified within  the neck. Thyroid gland within normal limits. Emphysematous changes noted within the visualized lungs.  IMPRESSION: 1. Focal narrowing of approximately 50% of the left vertebral artery at the level of the left C2 vertebral body fractures. This finding may in part be related to vasospasm due to the adjacent fracture. No frank dissection flap identified. 2. No other acute traumatic injury to the major arterial vasculature of the neck. 3. Stable cervical spine fractures, better evaluated on prior CT of the cervical spine. Results were called by telephone at the time of interpretation on 03/15/2014 at 4:16 am to Dr. Warnell ForesterREY WOFFORD , who verbally acknowledged these results.   Electronically Signed   By: Rise MuBenjamin  McClintock M.D.   On: 03/15/2014 04:17   Ct Chest W Contrast  03/15/2014   CLINICAL DATA:  Initial evaluation for acute trauma. Fall down 10 steps.  EXAM: CT CHEST, ABDOMEN, AND PELVIS WITH CONTRAST  TECHNIQUE: Multidetector CT imaging of the chest, abdomen and pelvis was performed following the standard protocol during bolus administration of intravenous contrast.  CONTRAST:  50 cc  of Omni 300.  COMPARISON:  None.  FINDINGS: CT CHEST FINDINGS  Scattered shotty bilateral hilar lymph nodes are noted. No pathologically enlarged mediastinal, hilar, or axillary lymph nodes are identified.  Intrathoracic aorta is of normal caliber and appearance. Scattered calcified plaque present within the aortic arch. No evidence for acute traumatic aortic injury. No mediastinal hematoma.  Heart size is normal.  No pericardial effusion.  Evaluation of the lungs is somewhat limited by motion artifact. Upper lobe predominant centrilobular emphysema present. Atelectatic changes seen dependently within the bilateral lower lobes. No pneumothorax. No focal infiltrates to suggest pulmonary contusion or infection. No pulmonary edema or pleural effusion.  Compression deformity at the superior endplate of T3 is suspicious for possible  acute fracture (series 204, image 71). There is approximately 30% of height loss without significant retropulsion. There is an additional compression deformity of the T5 vertebral body with approximately 20-30% of height loss with a retropulsion, also suspicious for acute fracture. No other acute fracture or other osseous abnormality within the thorax.  CT ABDOMEN AND PELVIS FINDINGS  Scattered subcentimeter hypodensities within the spleen are noted, too small the characterize by CT, but may reflect small cyst. A 9 mm hyperdense lesion within the left hepatic lobe is noted, indeterminate, but likely a small benign hemangioma. Liver is otherwise unremarkable. Gallbladder within normal limits. No biliary dilatation. Spleen is intact. No perisplenic hematoma. Adrenal glands and pancreas are within normal limits.  Kidneys are equal in size with symmetric enhancement. No nephrolithiasis, hydronephrosis, or focal enhancing renal mass.  Stomach within normal limits. No evidence for bowel obstruction or acute bowel injury. No abnormal wall thickening, mucosal enhancement, or inflammatory fat stranding seen about the bowels. Prominent fecal impaction present within the rectal vault.  Bladder intact an within normal limits. Uterus and ovaries are not visualized.  No adenopathy. No free air or fluid. Mild mesenteric edema noted centrally within the abdomen, of uncertain clinical significance.  Moderate atheromatous plaque present within the infrarenal aorta without associated aneurysm. No contrast extravasation. No mesenteric or retroperitoneal hematoma.  No acute fracture within the pelvis. No spinal fracture within the abdomen and pelvis. Degenerative changes noted about the L4-5 intervertebral disc space.  IMPRESSION: 1. Compression deformities of the T3 and T5 vertebral bodies with associated 20-30% of height loss as above, suspicious for acute compression fractures. No significant retropulsion. 2. No other acute traumatic  injury within the chest, abdomen, and pelvis. 3. Prominent fecal impaction within the rectal vault without associated obstruction.   Electronically Signed   By: Rise Mu M.D.   On: 03/15/2014 05:38   Ct Cervical Spine Wo Contrast  03/15/2014   CLINICAL DATA:  Fall.  EXAM: CT HEAD WITHOUT CONTRAST  CT CERVICAL SPINE WITHOUT CONTRAST  TECHNIQUE: Multidetector CT imaging of the head and cervical spine was performed following the standard protocol without intravenous contrast. Multiplanar CT image reconstructions of the cervical spine were also generated.  COMPARISON:  09/04/09  FINDINGS: CT HEAD FINDINGS  Prominence of the sulci and ventricles consistent with brain atrophy. Chronic right basal ganglia lacunar infarct noted. No evidence for acute intracranial hemorrhage, infarct or mass. The paranasal sinuses are clear. The mastoid air cells are clear. The calvarium appears intact.  CT CERVICAL SPINE FINDINGS  Straightening of normal cervical lordosis. The vertebral body heights are well preserved. There is multi level disc space narrowing and ventral endplate spurring compatible with degenerative disc disease. The facet joints are aligned bilaterally. There is a nondisplaced fracture involving  the posterior arch of C1 on the left, image 28/series 8. There is a nondisplaced base of dens fracture which extends into the left side of the vertebral body. Comminuted fracture deformity involves the left-sided transverse process of C2 with mild posterior displacement of fracture fragments into the canal of the left vertebral artery. The left posterior arch of C3 fracture is also noted lower cervical spine appears intact.  IMPRESSION: 1. No acute intracranial abnormalities. 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 2. Base of dens fracture which extends into the left side of the vertebral body. 3. Comminuted fracture involving the left transverse process of the C2 vertebra with possible  involvement of the vertebral canal 4. C3 Left lamina fracture and C1 left posterior arch fracture, nondisplaced 5. Cervical degenerative disc disease. 6. Critical Value/emergent results were called by telephone at the time of interpretation on 03/15/2014 at 2:01 am to Dr. Warnell ForesterREY WOFFORD , who verbally acknowledged these results.   Electronically Signed   By: Signa Kellaylor  Stroud M.D.   On: 03/15/2014 02:03   Ct Abdomen Pelvis W Contrast  03/15/2014   CLINICAL DATA:  Initial evaluation for acute trauma. Fall down 10 steps.  EXAM: CT CHEST, ABDOMEN, AND PELVIS WITH CONTRAST  TECHNIQUE: Multidetector CT imaging of the chest, abdomen and pelvis was performed following the standard protocol during bolus administration of intravenous contrast.  CONTRAST:  50 cc of Omni 300.  COMPARISON:  None.  FINDINGS: CT CHEST FINDINGS  Scattered shotty bilateral hilar lymph nodes are noted. No pathologically enlarged mediastinal, hilar, or axillary lymph nodes are identified.  Intrathoracic aorta is of normal caliber and appearance. Scattered calcified plaque present within the aortic arch. No evidence for acute traumatic aortic injury. No mediastinal hematoma.  Heart size is normal.  No pericardial effusion.  Evaluation of the lungs is somewhat limited by motion artifact. Upper lobe predominant centrilobular emphysema present. Atelectatic changes seen dependently within the bilateral lower lobes. No pneumothorax. No focal infiltrates to suggest pulmonary contusion or infection. No pulmonary edema or pleural effusion.  Compression deformity at the superior endplate of T3 is suspicious for possible acute fracture (series 204, image 71). There is approximately 30% of height loss without significant retropulsion. There is an additional compression deformity of the T5 vertebral body with approximately 20-30% of height loss with a retropulsion, also suspicious for acute fracture. No other acute fracture or other osseous abnormality within the  thorax.  CT ABDOMEN AND PELVIS FINDINGS  Scattered subcentimeter hypodensities within the spleen are noted, too small the characterize by CT, but may reflect small cyst. A 9 mm hyperdense lesion within the left hepatic lobe is noted, indeterminate, but likely a small benign hemangioma. Liver is otherwise unremarkable. Gallbladder within normal limits. No biliary dilatation. Spleen is intact. No perisplenic hematoma. Adrenal glands and pancreas are within normal limits.  Kidneys are equal in size with symmetric enhancement. No nephrolithiasis, hydronephrosis, or focal enhancing renal mass.  Stomach within normal limits. No evidence for bowel obstruction or acute bowel injury. No abnormal wall thickening, mucosal enhancement, or inflammatory fat stranding seen about the bowels. Prominent fecal impaction present within the rectal vault.  Bladder intact an within normal limits. Uterus and ovaries are not visualized.  No adenopathy. No free air or fluid. Mild mesenteric edema noted centrally within the abdomen, of uncertain clinical significance.  Moderate atheromatous plaque present within the infrarenal aorta without associated aneurysm. No contrast extravasation. No mesenteric or retroperitoneal hematoma.  No acute fracture within the pelvis.  No spinal fracture within the abdomen and pelvis. Degenerative changes noted about the L4-5 intervertebral disc space.  IMPRESSION: 1. Compression deformities of the T3 and T5 vertebral bodies with associated 20-30% of height loss as above, suspicious for acute compression fractures. No significant retropulsion. 2. No other acute traumatic injury within the chest, abdomen, and pelvis. 3. Prominent fecal impaction within the rectal vault without associated obstruction.   Electronically Signed   By: Rise Mu M.D.   On: 03/15/2014 05:38     Assessment/Plan: 68 year old female with frontotemporal dementia who is nonverbal who fell down stairs this morning and has a  nondisplaced C1 ring fracture with a type III odontoid fracture and a nondisplaced linear fracture through the lamina of C3. She also has minor fractures of T3 and T5 need no significant bracing. Remain in her cervical collar. Mobilize as tolerated. Pain control. Given her advanced takes disease or do not think she would ever be a candidate for surgery. She will remain in the collar for 3-6 months and follow up with Dr. Wynetta Emery as an outpatient for serial imaging.  Adana Marik S 03/15/2014 10:05 AM

## 2014-03-15 NOTE — ED Notes (Signed)
The tech gave patient's ring from the right hand to the husband. The ring is gold with a pearl in the middle of diamonds on both sides. The tech has reported to the RN in charge.

## 2014-03-16 ENCOUNTER — Encounter (HOSPITAL_COMMUNITY): Payer: Self-pay | Admitting: Cardiology

## 2014-03-16 DIAGNOSIS — W19XXXA Unspecified fall, initial encounter: Secondary | ICD-10-CM

## 2014-03-16 DIAGNOSIS — E039 Hypothyroidism, unspecified: Secondary | ICD-10-CM

## 2014-03-16 DIAGNOSIS — I441 Atrioventricular block, second degree: Secondary | ICD-10-CM | POA: Clinically undetermined

## 2014-03-16 DIAGNOSIS — F0391 Unspecified dementia with behavioral disturbance: Secondary | ICD-10-CM

## 2014-03-16 DIAGNOSIS — I442 Atrioventricular block, complete: Secondary | ICD-10-CM | POA: Clinically undetermined

## 2014-03-16 LAB — BASIC METABOLIC PANEL
ANION GAP: 14 (ref 5–15)
BUN: 11 mg/dL (ref 6–23)
CHLORIDE: 103 meq/L (ref 96–112)
CO2: 22 meq/L (ref 19–32)
CREATININE: 0.6 mg/dL (ref 0.50–1.10)
Calcium: 9.3 mg/dL (ref 8.4–10.5)
GFR calc Af Amer: >90 mL/min (ref >90)
GFR calc non Af Amer: >90 mL/min (ref >90)
Glucose, Bld: 116 mg/dL — ABNORMAL HIGH (ref 70–99)
Potassium: 4 mEq/L (ref 3.7–5.3)
Sodium: 139 mEq/L (ref 137–147)

## 2014-03-16 LAB — CBC
HCT: 38.4 % (ref 36.0–46.0)
HEMOGLOBIN: 12.6 g/dL (ref 12.0–15.0)
MCH: 28.4 pg (ref 26.0–34.0)
MCHC: 32.8 g/dL (ref 30.0–36.0)
MCV: 86.5 fL (ref 78.0–100.0)
Platelets: 196 10*3/uL (ref 150–400)
RBC: 4.44 MIL/uL (ref 3.87–5.11)
RDW: 13.1 % (ref 11.5–15.5)
WBC: 6 10*3/uL (ref 4.0–10.5)

## 2014-03-16 LAB — GLUCOSE, CAPILLARY: GLUCOSE-CAPILLARY: 104 mg/dL — AB (ref 70–99)

## 2014-03-16 LAB — MAGNESIUM: Magnesium: 2.1 mg/dL (ref 1.5–2.5)

## 2014-03-16 LAB — URINE CULTURE
CULTURE: NO GROWTH
Colony Count: NO GROWTH

## 2014-03-16 LAB — TSH: TSH: 2.24 u[IU]/mL (ref 0.350–4.500)

## 2014-03-16 MED ORDER — MINERAL OIL RE ENEM
1.0000 | ENEMA | Freq: Every day | RECTAL | Status: DC | PRN
  Filled 2014-03-16: qty 1

## 2014-03-16 MED ORDER — SENNOSIDES-DOCUSATE SODIUM 8.6-50 MG PO TABS
2.0000 | ORAL_TABLET | Freq: Two times a day (BID) | ORAL | Status: DC
  Administered 2014-03-16 – 2014-03-20 (×7): 2 via ORAL
  Filled 2014-03-16 (×7): qty 2

## 2014-03-16 MED ORDER — RESOURCE THICKENUP CLEAR PO POWD
ORAL | Status: DC | PRN
  Filled 2014-03-16: qty 125

## 2014-03-16 MED ORDER — POLYETHYLENE GLYCOL 3350 17 G PO PACK
17.0000 g | PACK | Freq: Every day | ORAL | Status: DC
  Administered 2014-03-16 – 2014-03-20 (×3): 17 g via ORAL
  Filled 2014-03-16 (×3): qty 1

## 2014-03-16 NOTE — Progress Notes (Signed)
PATIENT DETAILS Name: Brandi EdwardDiane L Lehn Age: 68 y.o. Sex: female Date of Birth: [birth year] Admit Date: 03/14/2014 Admitting Physician Eduard ClosArshad N Kakrakandy, MD ZOX:WRUEAVPCP:SNIDER, Jill SideALISON, MD  Subjective: Non verbal-will squeeze my hands on commands.   Assessment/Plan: Active Problems:  Mechanical fall: No syncope. Await PT/OT evaluation.    Base of dens fracture/Comminuted fracture of left transverse process of C3/C3 Left lamina fracture and C1 left posterior arch fracture/acute compression fracture of T3/T5 vertebral bodies: Secondary to a mechanical fall. Seen by neurosurgery, not a surgical candidate, recommendations are pain control, mobilize as tolerated, and remain in a cervical collar for 3-6 months. Outpatient follow-up with Dr. Wynetta Emeryram    Nondisplaced left radial neck fracture: Seen by hand surgeon/orthopedics, okay to leave out of sling or splint. Follow-up with Dr. Janee Mornhompson in 2 weeks for recheck.    Fecal impaction: Fleet enema 1 today, place on oral MiraLAX/Senokot.    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 Follow some commands and will squeeze my hands. Continue Namenda and Aricept.    Hypothyroidism: Continue levothyroxine.  Disposition: Remain inpatient-either SNF or home health PT  Antibiotics:  Anti-infectives    None     DVT Prophylaxis: Prophylactic Heparin   Code Status: Full code  Family Communication None at bedside-but called daughter Brandi KatosKristene Schneider [phone]  Procedures:  None  CONSULTS:  None  Time spent 40 minutes-which includes 50% of the time with face-to-face with patient/ family and coordinating care related to the above assessment and plan.  MEDICATIONS: Scheduled Meds: . cholecalciferol  1,000 Units Oral Daily  . donepezil  10 mg Oral Daily  . estrogens (conjugated)  0.3 mg Oral Daily  . heparin  5,000 Units Subcutaneous 3 times per day  . levothyroxine  75 mcg Oral QAC breakfast  . memantine  10 mg Oral BID  .  mirabegron ER  25 mg Oral Daily  . mirtazapine  15 mg Oral QHS  . polyethylene glycol  17 g Oral Daily  . senna-docusate  2 tablet Oral BID  . sodium chloride  3 mL Intravenous Q12H   Continuous Infusions:  PRN Meds:.acetaminophen **OR** acetaminophen, HYDROcodone-acetaminophen, mineral oil    PHYSICAL EXAM: Vital signs in last 24 hours: Filed Vitals:   03/15/14 1823 03/16/14 0117 03/16/14 0533 03/16/14 0921  BP: 137/74 135/71 125/67 151/86  Pulse: 91 92 80 86  Temp: 98.2 F (36.8 C) 99.3 F (37.4 C) 97.3 F (36.3 C) 98.2 F (36.8 C)  TempSrc: Oral Axillary Axillary Axillary  Resp: 20 18 16 18   Height:      Weight:      SpO2: 96% 95% 95% 91%    Weight change:  Filed Weights   03/15/14 0004  Weight: 72.576 kg (160 lb)   Body mass index is 25.84 kg/(m^2).   Gen Exam: Awake, non verbal at baseline. Follows some commands Neck: Supple, No JVD.  Cervical collar in place Chest: B/L Clear.   CVS: S1 S2 Regular, no murmurs.  Abdomen: soft, BS +, non tender, non distended.  Extremities: no edema, lower extremities warm to touch. Neurologic: Gen weakness-but seems to move all 4 ext Skin: No Rash.   Wounds: N/A.    Intake/Output from previous day:  Intake/Output Summary (Last 24 hours) at 03/16/14 1005 Last data filed at 03/15/14 1233  Gross per 24 hour  Intake    120 ml  Output      0 ml  Net    120 ml  LAB RESULTS: CBC  Recent Labs Lab 03/15/14 0050 03/16/14 0421  WBC 9.5 6.0  HGB 13.5 12.6  HCT 41.1 38.4  PLT 187 196  MCV 88.0 86.5  MCH 28.9 28.4  MCHC 32.8 32.8  RDW 13.0 13.1  LYMPHSABS 1.2  --   MONOABS 0.6  --   EOSABS 0.1  --   BASOSABS 0.0  --     Chemistries   Recent Labs Lab 03/15/14 0050 03/16/14 0421  NA 140 139  K 4.3 4.0  CL 103 103  CO2 22 22  GLUCOSE 147* 116*  BUN 17 11  CREATININE 0.65 0.60  CALCIUM 9.6 9.3    CBG: No results for input(s): GLUCAP in the last 168 hours.  GFR Estimated Creatinine Clearance: 69.6  mL/min (by C-G formula based on Cr of 0.6).  Coagulation profile No results for input(s): INR, PROTIME in the last 168 hours.  Cardiac Enzymes No results for input(s): CKMB, TROPONINI, MYOGLOBIN in the last 168 hours.  Invalid input(s): CK  Invalid input(s): POCBNP No results for input(s): DDIMER in the last 72 hours. No results for input(s): HGBA1C in the last 72 hours. No results for input(s): CHOL, HDL, LDLCALC, TRIG, CHOLHDL, LDLDIRECT in the last 72 hours. No results for input(s): TSH, T4TOTAL, T3FREE, THYROIDAB in the last 72 hours.  Invalid input(s): FREET3 No results for input(s): VITAMINB12, FOLATE, FERRITIN, TIBC, IRON, RETICCTPCT in the last 72 hours. No results for input(s): LIPASE, AMYLASE in the last 72 hours.  Urine Studies No results for input(s): UHGB, CRYS in the last 72 hours.  Invalid input(s): UACOL, UAPR, USPG, UPH, UTP, UGL, UKET, UBIL, UNIT, UROB, ULEU, UEPI, UWBC, URBC, UBAC, CAST, UCOM, BILUA  MICROBIOLOGY: No results found for this or any previous visit (from the past 240 hour(s)).  RADIOLOGY STUDIES/RESULTS: Dg Chest 1 View  03/15/2014   CLINICAL DATA:  Fall.  Dementia  EXAM: CHEST - 1 VIEW  COMPARISON:  None  FINDINGS: Normal heart size. There is no pleural effusion identified. Low lung volumes and asymmetric elevation of the right hemidiaphragm noted. Scar versus platelike atelectasis is identified in the left base. There is coarsened interstitial markings noted bilaterally.  IMPRESSION: 1. Left base scar versus atelectasis. 2. Diffuse, chronic appearing interstitial coarsening.   Electronically Signed   By: Signa Kell M.D.   On: 03/15/2014 01:46   Dg Pelvis 1-2 Views  03/15/2014   CLINICAL DATA:  Initial evaluation for acute trauma.  Fall.  EXAM: PELVIS - 1-2 VIEW  COMPARISON:  None.  FINDINGS: No definite fracture identified. Linear lucency traversing the left pubis symphysis is favored to be chronic in nature or perhaps related to summation of  overlying soft tissue shadows. No pelvic bone lesions are seen. SI joints are approximated. Lower lumbar spine grossly unremarkable.  IMPRESSION: 1. Age-indeterminate linear lucency traversing the left pubis symphysis. While this finding may be chronic in nature or perhaps related to summation of shadows, possible acute fracture is not excluded. Correlation with physical exam for possible fracture at this site recommended. 2. No other acute traumatic injury within the pelvis.   Electronically Signed   By: Rise Mu M.D.   On: 03/15/2014 01:51   Dg Elbow Complete Left  03/15/2014   CLINICAL DATA:  Fall.  Elbow laceration.  EXAM: LEFT ELBOW - COMPLETE 3+ VIEW  COMPARISON:  None.  FINDINGS: Cortical offset along the radial neck, only visualized in the frontal projection. Prominent anterior elbow fat pad suggesting small joint effusion.  No malalignment.  IMPRESSION: Suspect a nondisplaced radial neck fracture.   Electronically Signed   By: Tiburcio Pea M.D.   On: 03/15/2014 01:49   Ct Head Wo Contrast  03/15/2014   CLINICAL DATA:  Fall.  EXAM: CT HEAD WITHOUT CONTRAST  CT CERVICAL SPINE WITHOUT CONTRAST  TECHNIQUE: Multidetector CT imaging of the head and cervical spine was performed following the standard protocol without intravenous contrast. Multiplanar CT image reconstructions of the cervical spine were also generated.  COMPARISON:  09/04/09  FINDINGS: CT HEAD FINDINGS  Prominence of the sulci and ventricles consistent with brain atrophy. Chronic right basal ganglia lacunar infarct noted. No evidence for acute intracranial hemorrhage, infarct or mass. The paranasal sinuses are clear. The mastoid air cells are clear. The calvarium appears intact.  CT CERVICAL SPINE FINDINGS  Straightening of normal cervical lordosis. The vertebral body heights are well preserved. There is multi level disc space narrowing and ventral endplate spurring compatible with degenerative disc disease. The facet joints are  aligned bilaterally. There is a nondisplaced fracture involving the posterior arch of C1 on the left, image 28/series 8. There is a nondisplaced base of dens fracture which extends into the left side of the vertebral body. Comminuted fracture deformity involves the left-sided transverse process of C2 with mild posterior displacement of fracture fragments into the canal of the left vertebral artery. The left posterior arch of C3 fracture is also noted lower cervical spine appears intact.  IMPRESSION: 1. No acute intracranial abnormalities. 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 2. Base of dens fracture which extends into the left side of the vertebral body. 3. Comminuted fracture involving the left transverse process of the C2 vertebra with possible involvement of the vertebral canal 4. C3 Left lamina fracture and C1 left posterior arch fracture, nondisplaced 5. Cervical degenerative disc disease. 6. Critical Value/emergent results were called by telephone at the time of interpretation on 03/15/2014 at 2:01 am to Dr. Warnell Forester , who verbally acknowledged these results.   Electronically Signed   By: Signa Kell M.D.   On: 03/15/2014 02:03   Ct Angio Neck W/cm &/or Wo/cm  03/15/2014   EXAM: CT ANGIOGRAPHY NECK  TECHNIQUE: Multidetector CT imaging of the neck was performed using the standard protocol during bolus administration of intravenous contrast. Multiplanar CT image reconstructions and MIPs were obtained to evaluate the vascular anatomy. Carotid stenosis measurements (when applicable) are obtained utilizing NASCET criteria, using the distal internal carotid diameter as the denominator.  CONTRAST:  50mL OMNIPAQUE IOHEXOL 350 MG/ML SOLN  COMPARISON:  Prior CT of the cervical spine performed earlier on the same day.  FINDINGS: Scattered atheromatous plaque present within the visualize aortic arch which is of normal caliber. Aberrant right subclavian artery noted. No high-grade stenosis  seen at the origin of the great vessels. Subclavian arteries well opacified bilaterally.  There is a common origin of the common carotid arteries from the aortic arch. Common carotid arteries are tortuous in medialized into the mid neck and retropharyngeal space. No hemodynamically significant stenosis or evidence of traumatic injury identified within the common carotid arteries bilaterally. Carotid bifurcations within normal limits.  Internal carotid arteries are well opacified to the skullbase without evidence of dissection, occlusion, or hemodynamically significant stenosis. Visualized portions of the circle of Willis are unremarkable.  External carotid arteries and their branches are within normal limits.  Both vertebral arteries arise from the subclavian arteries. The left vertebral artery is dominant. The right vertebral artery is well  opacified along its entire course without evidence of acute traumatic injury, dissection, or stenosis.  Mild mass effect on the left V2/V3 segment as it courses by the fracture fragments at the level of the left transverse foramen. The vertebral artery delete is narrowed by approximately 50%, which may in part be related to Vasa spasm due to the adjacent fracture. This is best seen on sagittal projection (series 404, image 150). No dissection flap or pseudoaneurysm identified. Distally, the left vertebral artery is well opacified to the level of the vertebrobasilar junction.  Previously identified cervical spine fractures again noted, better characterized on prior CT. No other soft tissue abnormality within the neck.  No acute soft tissue abnormality identified within the neck. Thyroid gland within normal limits. Emphysematous changes noted within the visualized lungs.  IMPRESSION: 1. Focal narrowing of approximately 50% of the left vertebral artery at the level of the left C2 vertebral body fractures. This finding may in part be related to vasospasm due to the adjacent  fracture. No frank dissection flap identified. 2. No other acute traumatic injury to the major arterial vasculature of the neck. 3. Stable cervical spine fractures, better evaluated on prior CT of the cervical spine. Results were called by telephone at the time of interpretation on 03/15/2014 at 4:16 am to Dr. Warnell Forester , who verbally acknowledged these results.   Electronically Signed   By: Rise Mu M.D.   On: 03/15/2014 04:17   Ct Chest W Contrast  03/15/2014   CLINICAL DATA:  Initial evaluation for acute trauma. Fall down 10 steps.  EXAM: CT CHEST, ABDOMEN, AND PELVIS WITH CONTRAST  TECHNIQUE: Multidetector CT imaging of the chest, abdomen and pelvis was performed following the standard protocol during bolus administration of intravenous contrast.  CONTRAST:  50 cc of Omni 300.  COMPARISON:  None.  FINDINGS: CT CHEST FINDINGS  Scattered shotty bilateral hilar lymph nodes are noted. No pathologically enlarged mediastinal, hilar, or axillary lymph nodes are identified.  Intrathoracic aorta is of normal caliber and appearance. Scattered calcified plaque present within the aortic arch. No evidence for acute traumatic aortic injury. No mediastinal hematoma.  Heart size is normal.  No pericardial effusion.  Evaluation of the lungs is somewhat limited by motion artifact. Upper lobe predominant centrilobular emphysema present. Atelectatic changes seen dependently within the bilateral lower lobes. No pneumothorax. No focal infiltrates to suggest pulmonary contusion or infection. No pulmonary edema or pleural effusion.  Compression deformity at the superior endplate of T3 is suspicious for possible acute fracture (series 204, image 71). There is approximately 30% of height loss without significant retropulsion. There is an additional compression deformity of the T5 vertebral body with approximately 20-30% of height loss with a retropulsion, also suspicious for acute fracture. No other acute fracture or  other osseous abnormality within the thorax.  CT ABDOMEN AND PELVIS FINDINGS  Scattered subcentimeter hypodensities within the spleen are noted, too small the characterize by CT, but may reflect small cyst. A 9 mm hyperdense lesion within the left hepatic lobe is noted, indeterminate, but likely a small benign hemangioma. Liver is otherwise unremarkable. Gallbladder within normal limits. No biliary dilatation. Spleen is intact. No perisplenic hematoma. Adrenal glands and pancreas are within normal limits.  Kidneys are equal in size with symmetric enhancement. No nephrolithiasis, hydronephrosis, or focal enhancing renal mass.  Stomach within normal limits. No evidence for bowel obstruction or acute bowel injury. No abnormal wall thickening, mucosal enhancement, or inflammatory fat stranding seen about the bowels. Prominent  fecal impaction present within the rectal vault.  Bladder intact an within normal limits. Uterus and ovaries are not visualized.  No adenopathy. No free air or fluid. Mild mesenteric edema noted centrally within the abdomen, of uncertain clinical significance.  Moderate atheromatous plaque present within the infrarenal aorta without associated aneurysm. No contrast extravasation. No mesenteric or retroperitoneal hematoma.  No acute fracture within the pelvis. No spinal fracture within the abdomen and pelvis. Degenerative changes noted about the L4-5 intervertebral disc space.  IMPRESSION: 1. Compression deformities of the T3 and T5 vertebral bodies with associated 20-30% of height loss as above, suspicious for acute compression fractures. No significant retropulsion. 2. No other acute traumatic injury within the chest, abdomen, and pelvis. 3. Prominent fecal impaction within the rectal vault without associated obstruction.   Electronically Signed   By: Rise MuBenjamin  McClintock M.D.   On: 03/15/2014 05:38   Ct Cervical Spine Wo Contrast  03/15/2014   CLINICAL DATA:  Fall.  EXAM: CT HEAD WITHOUT  CONTRAST  CT CERVICAL SPINE WITHOUT CONTRAST  TECHNIQUE: Multidetector CT imaging of the head and cervical spine was performed following the standard protocol without intravenous contrast. Multiplanar CT image reconstructions of the cervical spine were also generated.  COMPARISON:  09/04/09  FINDINGS: CT HEAD FINDINGS  Prominence of the sulci and ventricles consistent with brain atrophy. Chronic right basal ganglia lacunar infarct noted. No evidence for acute intracranial hemorrhage, infarct or mass. The paranasal sinuses are clear. The mastoid air cells are clear. The calvarium appears intact.  CT CERVICAL SPINE FINDINGS  Straightening of normal cervical lordosis. The vertebral body heights are well preserved. There is multi level disc space narrowing and ventral endplate spurring compatible with degenerative disc disease. The facet joints are aligned bilaterally. There is a nondisplaced fracture involving the posterior arch of C1 on the left, image 28/series 8. There is a nondisplaced base of dens fracture which extends into the left side of the vertebral body. Comminuted fracture deformity involves the left-sided transverse process of C2 with mild posterior displacement of fracture fragments into the canal of the left vertebral artery. The left posterior arch of C3 fracture is also noted lower cervical spine appears intact.  IMPRESSION: 1. No acute intracranial abnormalities. 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 2. Base of dens fracture which extends into the left side of the vertebral body. 3. Comminuted fracture involving the left transverse process of the C2 vertebra with possible involvement of the vertebral canal 4. C3 Left lamina fracture and C1 left posterior arch fracture, nondisplaced 5. Cervical degenerative disc disease. 6. Critical Value/emergent results were called by telephone at the time of interpretation on 03/15/2014 at 2:01 am to Dr. Warnell ForesterREY WOFFORD , who verbally acknowledged  these results.   Electronically Signed   By: Signa Kellaylor  Stroud M.D.   On: 03/15/2014 02:03   Ct Abdomen Pelvis W Contrast  03/15/2014   CLINICAL DATA:  Initial evaluation for acute trauma. Fall down 10 steps.  EXAM: CT CHEST, ABDOMEN, AND PELVIS WITH CONTRAST  TECHNIQUE: Multidetector CT imaging of the chest, abdomen and pelvis was performed following the standard protocol during bolus administration of intravenous contrast.  CONTRAST:  50 cc of Omni 300.  COMPARISON:  None.  FINDINGS: CT CHEST FINDINGS  Scattered shotty bilateral hilar lymph nodes are noted. No pathologically enlarged mediastinal, hilar, or axillary lymph nodes are identified.  Intrathoracic aorta is of normal caliber and appearance. Scattered calcified plaque present within the aortic arch. No evidence for acute  traumatic aortic injury. No mediastinal hematoma.  Heart size is normal.  No pericardial effusion.  Evaluation of the lungs is somewhat limited by motion artifact. Upper lobe predominant centrilobular emphysema present. Atelectatic changes seen dependently within the bilateral lower lobes. No pneumothorax. No focal infiltrates to suggest pulmonary contusion or infection. No pulmonary edema or pleural effusion.  Compression deformity at the superior endplate of T3 is suspicious for possible acute fracture (series 204, image 71). There is approximately 30% of height loss without significant retropulsion. There is an additional compression deformity of the T5 vertebral body with approximately 20-30% of height loss with a retropulsion, also suspicious for acute fracture. No other acute fracture or other osseous abnormality within the thorax.  CT ABDOMEN AND PELVIS FINDINGS  Scattered subcentimeter hypodensities within the spleen are noted, too small the characterize by CT, but may reflect small cyst. A 9 mm hyperdense lesion within the left hepatic lobe is noted, indeterminate, but likely a small benign hemangioma. Liver is otherwise  unremarkable. Gallbladder within normal limits. No biliary dilatation. Spleen is intact. No perisplenic hematoma. Adrenal glands and pancreas are within normal limits.  Kidneys are equal in size with symmetric enhancement. No nephrolithiasis, hydronephrosis, or focal enhancing renal mass.  Stomach within normal limits. No evidence for bowel obstruction or acute bowel injury. No abnormal wall thickening, mucosal enhancement, or inflammatory fat stranding seen about the bowels. Prominent fecal impaction present within the rectal vault.  Bladder intact an within normal limits. Uterus and ovaries are not visualized.  No adenopathy. No free air or fluid. Mild mesenteric edema noted centrally within the abdomen, of uncertain clinical significance.  Moderate atheromatous plaque present within the infrarenal aorta without associated aneurysm. No contrast extravasation. No mesenteric or retroperitoneal hematoma.  No acute fracture within the pelvis. No spinal fracture within the abdomen and pelvis. Degenerative changes noted about the L4-5 intervertebral disc space.  IMPRESSION: 1. Compression deformities of the T3 and T5 vertebral bodies with associated 20-30% of height loss as above, suspicious for acute compression fractures. No significant retropulsion. 2. No other acute traumatic injury within the chest, abdomen, and pelvis. 3. Prominent fecal impaction within the rectal vault without associated obstruction.   Electronically Signed   By: Rise Mu M.D.   On: 03/15/2014 05:38    Jeoffrey Massed, MD  [facility] Pager:[phone]  If 7PM-7AM, please contact night-coverage [email or web address] Password [email or web address] 03/16/2014, 10:05 AM   LOS: 2 days

## 2014-03-16 NOTE — Evaluation (Signed)
Physical Therapy Evaluation Patient Details Name: Brandi Schneider MRN: [id number] DOB: 1945-04-30 Today's Date: 03/16/2014   History of Present Illness  Adm 03/15/14 s/p fall (unwitnessed) down 12 indoor steps. Pt with C1-3 fractures and T3 & T5 compression fractures (stable, non-surgical). Lt radial neck fracture with ortho stating stable and no splint or sling required. PMHx- non-verbal due to dementia; intrinsic hand muscle wasting  Clinical Impression  Pt admitted with above injuries s/p fall. Lengthy discussion with pt's daughter via phone re: home set-up and possible modifications for home to increase pt safety. She does provide 24 hour care to pt. She denies prior falls and pt was independent walking in the home (including stairs). Pt currently requiring physical cues/facilitation to initiate movements. Daughter is available tomorrow 11/28 @ 1:00 for family education (bed mobility, transfer training, gait and stair training). Pt currently with functional limitations due to the deficits listed below (see PT Problem List).  Pt will benefit from skilled PT to increase their independence and safety with mobility to allow discharge to the venue listed below.       Follow Up Recommendations Home health PT;Supervision/Assistance - 24 hour    Equipment Recommendations  None recommended by PT    Recommendations for Other Services       Precautions / Restrictions Precautions Precautions: Cervical;Fall Required Braces or Orthoses: Cervical Brace Cervical Brace: Hard collar;At all times Restrictions Weight Bearing Restrictions: No      Mobility  Bed Mobility Overal bed mobility: Needs Assistance Bed Mobility: Rolling;Sidelying to Sit;Sit to Sidelying Rolling: Min assist Sidelying to sit: Min assist     Sit to sidelying: Min assist General bed mobility comments: hand over hand guidance to bend knees and roll to her Rt; assisted legs off EOB and gestured for pt to sit up and she  initiated, however required assist; return to bed required physical cues to initiate sit to side and assist to bring 1st leg up onto bed  Transfers Overall transfer level: Needs assistance Equipment used: 1 person hand held assist Transfers: Sit to/from Stand Sit to Stand: Min assist         General transfer comment: x2; facilitation to initiate motion and then pt completes  Ambulation/Gait Ambulation/Gait assistance: Min guard Ambulation Distance (Feet): 150 Feet Assistive device: 1 person hand held assist Gait Pattern/deviations: Step-through pattern;Decreased stride length;Wide base of support   Gait velocity interpretation: Below normal speed for age/gender General Gait Details: no arm swing or trunk rotation; slightly excessive lateral weight shifts with wide BOS; light hand held assist for guidance/direction not for balance  Stairs            Wheelchair Mobility    Modified Rankin (Stroke Patients Only)       Balance Overall balance assessment: Needs assistance         Standing balance support: No upper extremity supported Standing balance-Leahy Scale: Fair                               Pertinent Vitals/Pain 94% on 2L at rest;  91% at rest room air 86% room air walking with 2L O2 resumed on return to room  Pain Assessment: Faces Faces Pain Scale: No hurt    Home Living Family/patient expects to be discharged to:: Private residence Living Arrangements: Spouse/significant other;Children (daughter cares for her mother; reports spouse is "hands off") Available Help at Discharge: Family;Available 24 hours/day Type of Home: House  Home Layout: Two level;Bed/bath upstairs Home Equipment: None      Prior Function Level of Independence: Needs assistance   Gait / Transfers Assistance Needed: daughter reports pt was generally steady with walking (has become more rigid lately), but had been safe ascending/descending stairs and walking  indoors independently  ADL's / Homemaking Assistance Needed: daugher assisted due to decr cogntion        Hand Dominance        Extremity/Trunk Assessment   Upper Extremity Assessment: Defer to OT evaluation           Lower Extremity Assessment: Overall WFL for tasks assessed      Cervical / Trunk Assessment: Normal  Communication   Communication: Expressive difficulties;Receptive difficulties (requires physical cues to perform tasks)  Cognition Arousal/Alertness: Awake/alert Behavior During Therapy: WFL for tasks assessed/performed Overall Cognitive Status: History of cognitive impairments - at baseline                      General Comments General comments (skin integrity, edema, etc.): Called and spoke with pt's daughter re: prior status and home set-up. She reports pt primarily stays upstairs during day and there is not room to move her downstairs. Concerned re: pt's tall/high bed and pt normally "crawls" into bed forwards. Educated on possibly removing box spring and using plywood to support mattress, however daughter reports it is a Psychologist, counsellingKing size mattress and she has no one to help her do this. Discussed safety concerns with use of step-stool, however if necessary to get as wide a step as she can find and have pt step up backwards ready to sit down on EOB. Recommended use of "baby gates" at stairs to prevent pt attempting stairs on her own and she reports she was already planning to buy some.     Exercises        Assessment/Plan    PT Assessment Patient needs continued PT services  PT Diagnosis Difficulty walking   PT Problem List Decreased balance;Decreased mobility;Decreased cognition;Decreased safety awareness;Decreased knowledge of precautions  PT Treatment Interventions Gait training;Stair training;Functional mobility training;Therapeutic activities;Patient/family education   PT Goals (Current goals can be found in the Care Plan section) Acute Rehab PT  Goals Patient Stated Goal: unable due to non-verbal/dementia PT Goal Formulation: With family Time For Goal Achievement: 03/19/14 Potential to Achieve Goals: Good    Frequency Min 5X/week   Barriers to discharge        Co-evaluation               End of Session Equipment Utilized During Treatment: Gait belt;Cervical collar Activity Tolerance: Patient tolerated treatment well Patient left: in bed;with call bell/phone within reach;with bed alarm set Nurse Communication: Mobility status (+ BM while on toilet; possible toileting schedule for pt)         Time: [id number] PT Time Calculation (min) (ACUTE ONLY): 25 min   Charges:   PT Evaluation $Initial PT Evaluation Tier I: 1 Procedure PT Treatments $Gait Training: 8-22 mins   PT G Codes:          Clemmie Marxen 03/16/2014, 12:28 PM Pager [phone]

## 2014-03-16 NOTE — Plan of Care (Signed)
Problem: Phase I Progression Outcomes Goal: Voiding-avoid urinary catheter unless indicated Outcome: Completed/Met Date Met:  03/16/14 Goal: Hemodynamically stable Outcome: Completed/Met Date Met:  03/16/14  Problem: Phase II Progression Outcomes Goal: Vital signs remain stable Outcome: Completed/Met Date Met:  03/16/14 Goal: IV changed to normal saline lock Outcome: Completed/Met Date Met:  03/16/14 Goal: Obtain order to discontinue catheter if appropriate Outcome: Not Applicable Date Met:  74/45/14  Problem: Phase III Progression Outcomes Goal: Voiding independently Outcome: Completed/Met Date Met:  03/16/14 Goal: Foley discontinued Outcome: Not Applicable Date Met:  60/47/99

## 2014-03-16 NOTE — Progress Notes (Signed)
[facility] called for DME to be delivered to the patient's room today prior to discharging home; B Amboyhandler RN,BSN,MHA [phone]

## 2014-03-16 NOTE — Progress Notes (Signed)
Overall she appears much more stable today. She is not in any apparent pain. Her collar is in place. She is nonverbal. She moves her extremities. Please have her follow-up with Dr. Wynetta Emeryram 2-3 weeks after discharge from the hospital.

## 2014-03-16 NOTE — Progress Notes (Signed)
Talked to patient's daughter Doreen BeamKristene [phone]( [phone]) about HHC choices, [facility]; Marie with [facility] called for arrangements; Alexis GoodellB Shannara Winbush RN,BSN,MHA [phone]

## 2014-03-16 NOTE — Evaluation (Signed)
Clinical/Bedside Swallow Evaluation Patient Details  Name: Brandi Schneider MRN: [id number] Date of Birth: November 04, 1945  Today's Date: 03/16/2014 Time: 1020-1043 SLP Time Calculation (min) (ACUTE ONLY): 23 min  Past Medical History:  Past Medical History  Diagnosis Date  . Dementia   . Hypothyroid   . Hypercholesteremia    Past Surgical History:  Past Surgical History  Procedure Laterality Date  . Hysterotomy     HPI:      Assessment / Plan / Recommendation Clinical Impression  Pt presents with swallow difficulties consistent with findings of pt's who have cognitive deficits.  Slow mastication/oral transiting noted across consistencies with oral residuals of eggs.  Use of applesauce or nectar juice consumption helpful to clear residuals.  Mandibular ROM limited by cervical collar impacting mastication abilities.  No symptoms of aspiration or airway compromise with nectar, puree, or soft solids.    Pt able to hold a banana and feed it to herself - encourage pt self feeding as much as able with supervision.  Did not test thin liquids secondary to pt's known h/o choking on water and findings from this BSE.    Skiilled intervention included implementing swallow precautions/compensations found to be beneficial during session.  Recommend to downgrade diet for efficiency and airway protection.  Will follow up x1 for family education given pt's cognitive deficit and currently no family present.      Aspiration Risk  Mild    Diet Recommendation Dysphagia 3 (Mechanical Soft);Nectar-thick liquid   Liquid Administration via: Straw;Cup Medication Administration: Crushed with puree (whole with puree if tolerate) Supervision: Staff to assist with self feeding Compensations: Slow rate;Small sips/bites;Check for pocketing Postural Changes and/or Swallow Maneuvers: Seated upright 90 degrees;Upright 30-60 min after meal    Other  Recommendations Oral Care Recommendations: Oral care before and  after PO Other Recommendations: Order thickener from pharmacy   Follow Up Recommendations  None    Frequency and Duration min 1 x/week  1 week     Swallow Study Prior Functional Status   see hhx    General Date of Onset: 03/16/14 Type of Study: Bedside swallow evaluation Diet Prior to this Study: Regular;Nectar-thick liquids Temperature Spikes Noted: No Respiratory Status: Room air History of Recent Intubation: No Behavior/Cognition: Alert;Doesn't follow directions;Decreased sustained attention (pt nonverbal, has dementia) Oral Cavity - Dentition: Adequate natural dentition Self-Feeding Abilities: Needs assist Patient Positioning: Upright in bed Baseline Vocal Quality: Other (comment) (pt nonverbal) Volitional Swallow: Unable to elicit    Oral/Motor/Sensory Function Overall Oral Motor/Sensory Function:  (no focal CN deficits however pt did not follow commands,  able to seal lips on straw, cup)   Ice Chips Ice chips: Not tested   Thin Liquid Thin Liquid: Not tested Other Comments: DNT due to pt having choking at home on water and obvious delay in swallow    Nectar Thick Nectar Thick Liquid: Impaired Presentation: Cup;Straw;Spoon Oral Phase Impairments: Impaired anterior to posterior transit;Reduced lingual movement/coordination Oral phase functional implications: Prolonged oral transit Pharyngeal Phase Impairments: Suspected delayed Swallow Other Comments: pt with decreased ability to form suction on straw as intake continued, use of cup with pt attempting to self feed helpful   Honey Thick Honey Thick Liquid: Not tested   Puree Puree: Impaired Presentation: Spoon Oral Phase Impairments: Reduced lingual movement/coordination Oral Phase Functional Implications: Prolonged oral transit Pharyngeal Phase Impairments: Suspected delayed Swallow   Solid   GO    Solid: Impaired Presentation: Self Fed;Spoon Oral Phase Impairments: Reduced lingual movement/coordination;Impaired  anterior to posterior  transit;Poor awareness of bolus;Impaired mastication Oral Phase Functional Implications: Oral residue Pharyngeal Phase Impairments: Suspected delayed Swallow Other Comments: use of applesauce and nectar juice consumption helped to clear oral residuals      Brandi Burnetamara Seger Jani, MS [facility] SLP [phone]

## 2014-03-16 NOTE — Consult Note (Signed)
Reason for Consult:  Significant bradycardia and heart block  Referring Physician:  Dr. Sloan Leiter PCP:  Raeanne Gathers, MD Primary Cardiologist: Jenny Reichmann Daily is an 68 y.o. female.    Chief Complaint: fell at home and admitted 03/14/14   HPI:  68 year old female with a history of hypothyroidism, fronto-temporal dementia, presented after a presumed mechanical fall on the evening prior to admission. The patient is nonverbal at baseline, but is able to ambulate without any assistive devices at baseline. All this history is obtained from speaking with the patient's daughter at the bedside. Apparently, the patient was trying to walk up stairs when she had a and fell down 12-14 steps. There was no loss of consciousness the daughter is aware of.  She did not see her mother fall.   When she was found on the hardwood floor, her head was stuck underneath a metal shelf. EMS was activated, and the patient was brought to emergency. To the daughter's knowledge, Pt has not had any fever, chills, chest pain, sob, n/v/d. She has been eating well. At baseline, pt is incontinent of stool and urine.   In the emergency department, the patient the patient had numerous radiographic studies. CT of the chest revealed no acute intrathoracic trauma. There was left lower lobe atelectasis. CT of the abdomen and pelvis did not reveal any intra-abdominal trauma or hematoma. CT of the cervical spine revealed a comminuted C2 transverse process fracture as well as a left C3 lamina fracture and left C1 posterior arch fracture. Within the CT of the chest, the patient was also noted to have a T3 vertebral fracture as well as T5 compression fracture with 30% height loss. CT also revealed narrowing of the left vertebral artery of 50% at the level of C2.  She remains in her cervical collar ( which she will remain in for 3-6 months).  She also has a Lt radial head ND fracture.  EKG shows sinus rhythm with right bundle branch  block. BMP and CBC were unremarkable  Cardiology has been asked to see due to ventricular asystole of 5 sec.  Also 2:1 block with HR at 30.  Brief episodes.  No hx of cardiac issues.  Past Medical History  Diagnosis Date  . Dementia   . Hypothyroid   . Hypercholesteremia     Past Surgical History  Procedure Laterality Date  . Hysterotomy      Family History  Problem Relation Age of Onset  . Breast cancer Mother   . Stroke Father    Social History:  reports that she has quit smoking. She has never used smokeless tobacco. She reports that she does not drink alcohol or use illicit drugs.  Allergies: No Known Allergies  Medications Prior to Admission  Medication Sig Dispense Refill  . cholecalciferol (VITAMIN D) 1000 UNITS tablet Take 1,000 Units by mouth daily.    . Coconut Oil OIL Take 45 mLs by mouth 3 (three) times daily.    Marland Kitchen levothyroxine (SYNTHROID, LEVOTHROID) 75 MCG tablet Take 75 mcg by mouth daily before breakfast.    . magnesium 30 MG tablet Take 30 mg by mouth 2 (two) times daily.     . memantine (NAMENDA) 10 MG tablet Take 10 mg by mouth 2 (two) times daily.    . mirabegron ER (MYRBETRIQ) 25 MG TB24 Take 25 mg by mouth daily.    . mirtazapine (REMERON) 15 MG tablet Take 15 mg by mouth at bedtime.    Marland Kitchen OVER  THE COUNTER MEDICATION Take 500 mg by mouth daily. "Tumeric"    . donepezil (ARICEPT) 10 MG tablet Take 10 mg by mouth every morning.    . estrogens, conjugated, (PREMARIN) 0.3 MG tablet Take 0.3 mg by mouth daily.    Marland Kitchen MIRTAZAPINE PO Take by mouth.      Results for orders placed or performed during the hospital encounter of 03/14/14 (from the past 48 hour(s))  CBC with Differential     Status: Abnormal   Collection Time: 03/15/14 12:50 AM  Result Value Ref Range   WBC 9.5 4.0 - 10.5 K/uL   RBC 4.67 3.87 - 5.11 MIL/uL   Hemoglobin 13.5 12.0 - 15.0 g/dL   HCT 41.1 36.0 - 46.0 %   MCV 88.0 78.0 - 100.0 fL   MCH 28.9 26.0 - 34.0 pg   MCHC 32.8 30.0 - 36.0  g/dL   RDW 13.0 11.5 - 15.5 %   Platelets 187 150 - 400 K/uL   Neutrophils Relative % 79 (H) 43 - 77 %   Neutro Abs 7.5 1.7 - 7.7 K/uL   Lymphocytes Relative 13 12 - 46 %   Lymphs Abs 1.2 0.7 - 4.0 K/uL   Monocytes Relative 7 3 - 12 %   Monocytes Absolute 0.6 0.1 - 1.0 K/uL   Eosinophils Relative 1 0 - 5 %   Eosinophils Absolute 0.1 0.0 - 0.7 K/uL   Basophils Relative 0 0 - 1 %   Basophils Absolute 0.0 0.0 - 0.1 K/uL  Basic metabolic panel     Status: Abnormal   Collection Time: 03/15/14 12:50 AM  Result Value Ref Range   Sodium 140 137 - 147 mEq/L   Potassium 4.3 3.7 - 5.3 mEq/L   Chloride 103 96 - 112 mEq/L   CO2 22 19 - 32 mEq/L   Glucose, Bld 147 (H) 70 - 99 mg/dL   BUN 17 6 - 23 mg/dL   Creatinine, Ser 0.65 0.50 - 1.10 mg/dL   Calcium 9.6 8.4 - 10.5 mg/dL   GFR calc non Af Amer 90 (L) >90 mL/min   GFR calc Af Amer >90 >90 mL/min    Comment: (NOTE) The eGFR has been calculated using the CKD EPI equation. This calculation has not been validated in all clinical situations. eGFR's persistently <90 mL/min signify possible Chronic Kidney Disease.    Anion gap 15 5 - 15  Urinalysis, Routine w reflex microscopic     Status: Abnormal   Collection Time: 03/15/14  6:38 PM  Result Value Ref Range   Color, Urine YELLOW YELLOW   APPearance CLOUDY (A) CLEAR   Specific Gravity, Urine 1.027 1.005 - 1.030   pH 6.0 5.0 - 8.0   Glucose, UA NEGATIVE NEGATIVE mg/dL   Hgb urine dipstick NEGATIVE NEGATIVE   Bilirubin Urine NEGATIVE NEGATIVE   Ketones, ur NEGATIVE NEGATIVE mg/dL   Protein, ur NEGATIVE NEGATIVE mg/dL   Urobilinogen, UA 0.2 0.0 - 1.0 mg/dL   Nitrite NEGATIVE NEGATIVE   Leukocytes, UA NEGATIVE NEGATIVE    Comment: MICROSCOPIC NOT DONE ON URINES WITH NEGATIVE PROTEIN, BLOOD, LEUKOCYTES, NITRITE, OR GLUCOSE <1000 mg/dL.  Basic metabolic panel     Status: Abnormal   Collection Time: 03/16/14  4:21 AM  Result Value Ref Range   Sodium 139 137 - 147 mEq/L   Potassium 4.0 3.7  - 5.3 mEq/L   Chloride 103 96 - 112 mEq/L   CO2 22 19 - 32 mEq/L   Glucose, Bld 116 (H)  70 - 99 mg/dL   BUN 11 6 - 23 mg/dL   Creatinine, Ser 0.60 0.50 - 1.10 mg/dL   Calcium 9.3 8.4 - 10.5 mg/dL   GFR calc non Af Amer >90 >90 mL/min   GFR calc Af Amer >90 >90 mL/min    Comment: (NOTE) The eGFR has been calculated using the CKD EPI equation. This calculation has not been validated in all clinical situations. eGFR's persistently <90 mL/min signify possible Chronic Kidney Disease.    Anion gap 14 5 - 15  CBC     Status: None   Collection Time: 03/16/14  4:21 AM  Result Value Ref Range   WBC 6.0 4.0 - 10.5 K/uL   RBC 4.44 3.87 - 5.11 MIL/uL   Hemoglobin 12.6 12.0 - 15.0 g/dL   HCT 38.4 36.0 - 46.0 %   MCV 86.5 78.0 - 100.0 fL   MCH 28.4 26.0 - 34.0 pg   MCHC 32.8 30.0 - 36.0 g/dL   RDW 13.1 11.5 - 15.5 %   Platelets 196 150 - 400 K/uL   Dg Chest 1 View  03/15/2014   CLINICAL DATA:  Fall.  Dementia  EXAM: CHEST - 1 VIEW  COMPARISON:  None  FINDINGS: Normal heart size. There is no pleural effusion identified. Low lung volumes and asymmetric elevation of the right hemidiaphragm noted. Scar versus platelike atelectasis is identified in the left base. There is coarsened interstitial markings noted bilaterally.  IMPRESSION: 1. Left base scar versus atelectasis. 2. Diffuse, chronic appearing interstitial coarsening.   Electronically Signed   By: Kerby Moors M.D.   On: 03/15/2014 01:46   Dg Pelvis 1-2 Views  03/15/2014   CLINICAL DATA:  Initial evaluation for acute trauma.  Fall.  EXAM: PELVIS - 1-2 VIEW  COMPARISON:  None.  FINDINGS: No definite fracture identified. Linear lucency traversing the left pubis symphysis is favored to be chronic in nature or perhaps related to summation of overlying soft tissue shadows. No pelvic bone lesions are seen. SI joints are approximated. Lower lumbar spine grossly unremarkable.  IMPRESSION: 1. Age-indeterminate linear lucency traversing the left pubis  symphysis. While this finding may be chronic in nature or perhaps related to summation of shadows, possible acute fracture is not excluded. Correlation with physical exam for possible fracture at this site recommended. 2. No other acute traumatic injury within the pelvis.   Electronically Signed   By: Jeannine Boga M.D.   On: 03/15/2014 01:51   Dg Elbow Complete Left  03/15/2014   CLINICAL DATA:  Fall.  Elbow laceration.  EXAM: LEFT ELBOW - COMPLETE 3+ VIEW  COMPARISON:  None.  FINDINGS: Cortical offset along the radial neck, only visualized in the frontal projection. Prominent anterior elbow fat pad suggesting small joint effusion. No malalignment.  IMPRESSION: Suspect a nondisplaced radial neck fracture.   Electronically Signed   By: Jorje Guild M.D.   On: 03/15/2014 01:49   Ct Head Wo Contrast  03/15/2014   CLINICAL DATA:  Fall.  EXAM: CT HEAD WITHOUT CONTRAST  CT CERVICAL SPINE WITHOUT CONTRAST  TECHNIQUE: Multidetector CT imaging of the head and cervical spine was performed following the standard protocol without intravenous contrast. Multiplanar CT image reconstructions of the cervical spine were also generated.  COMPARISON:  09/04/09  FINDINGS: CT HEAD FINDINGS  Prominence of the sulci and ventricles consistent with brain atrophy. Chronic right basal ganglia lacunar infarct noted. No evidence for acute intracranial hemorrhage, infarct or mass. The paranasal sinuses are clear. The  mastoid air cells are clear. The calvarium appears intact.  CT CERVICAL SPINE FINDINGS  Straightening of normal cervical lordosis. The vertebral body heights are well preserved. There is multi level disc space narrowing and ventral endplate spurring compatible with degenerative disc disease. The facet joints are aligned bilaterally. There is a nondisplaced fracture involving the posterior arch of C1 on the left, image 28/series 8. There is a nondisplaced base of dens fracture which extends into the left side of the  vertebral body. Comminuted fracture deformity involves the left-sided transverse process of C2 with mild posterior displacement of fracture fragments into the canal of the left vertebral artery. The left posterior arch of C3 fracture is also noted lower cervical spine appears intact.  IMPRESSION: 1. No acute intracranial abnormalities. 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 2. Base of dens fracture which extends into the left side of the vertebral body. 3. Comminuted fracture involving the left transverse process of the C2 vertebra with possible involvement of the vertebral canal 4. C3 Left lamina fracture and C1 left posterior arch fracture, nondisplaced 5. Cervical degenerative disc disease. 6. Critical Value/emergent results were called by telephone at the time of interpretation on 03/15/2014 at 2:01 am to Dr. Artis Delay , who verbally acknowledged these results.   Electronically Signed   By: Kerby Moors M.D.   On: 03/15/2014 02:03   Ct Angio Neck W/cm &/or Wo/cm  03/15/2014   EXAM: CT ANGIOGRAPHY NECK  TECHNIQUE: Multidetector CT imaging of the neck was performed using the standard protocol during bolus administration of intravenous contrast. Multiplanar CT image reconstructions and MIPs were obtained to evaluate the vascular anatomy. Carotid stenosis measurements (when applicable) are obtained utilizing NASCET criteria, using the distal internal carotid diameter as the denominator.  CONTRAST:  38m OMNIPAQUE IOHEXOL 350 MG/ML SOLN  COMPARISON:  Prior CT of the cervical spine performed earlier on the same day.  FINDINGS: Scattered atheromatous plaque present within the visualize aortic arch which is of normal caliber. Aberrant right subclavian artery noted. No high-grade stenosis seen at the origin of the great vessels. Subclavian arteries well opacified bilaterally.  There is a common origin of the common carotid arteries from the aortic arch. Common carotid arteries are tortuous in  medialized into the mid neck and retropharyngeal space. No hemodynamically significant stenosis or evidence of traumatic injury identified within the common carotid arteries bilaterally. Carotid bifurcations within normal limits.  Internal carotid arteries are well opacified to the skullbase without evidence of dissection, occlusion, or hemodynamically significant stenosis. Visualized portions of the circle of Willis are unremarkable.  External carotid arteries and their branches are within normal limits.  Both vertebral arteries arise from the subclavian arteries. The left vertebral artery is dominant. The right vertebral artery is well opacified along its entire course without evidence of acute traumatic injury, dissection, or stenosis.  Mild mass effect on the left V2/V3 segment as it courses by the fracture fragments at the level of the left transverse foramen. The vertebral artery delete is narrowed by approximately 50%, which may in part be related to Vasa spasm due to the adjacent fracture. This is best seen on sagittal projection (series 404, image 150). No dissection flap or pseudoaneurysm identified. Distally, the left vertebral artery is well opacified to the level of the vertebrobasilar junction.  Previously identified cervical spine fractures again noted, better characterized on prior CT. No other soft tissue abnormality within the neck.  No acute soft tissue abnormality identified within the neck. Thyroid  gland within normal limits. Emphysematous changes noted within the visualized lungs.  IMPRESSION: 1. Focal narrowing of approximately 50% of the left vertebral artery at the level of the left C2 vertebral body fractures. This finding may in part be related to vasospasm due to the adjacent fracture. No frank dissection flap identified. 2. No other acute traumatic injury to the major arterial vasculature of the neck. 3. Stable cervical spine fractures, better evaluated on prior CT of the cervical spine.  Results were called by telephone at the time of interpretation on 03/15/2014 at 4:16 am to Dr. Artis Delay , who verbally acknowledged these results.   Electronically Signed   By: Jeannine Boga M.D.   On: 03/15/2014 04:17   Ct Chest W Contrast  03/15/2014   CLINICAL DATA:  Initial evaluation for acute trauma. Fall down 10 steps.  EXAM: CT CHEST, ABDOMEN, AND PELVIS WITH CONTRAST  TECHNIQUE: Multidetector CT imaging of the chest, abdomen and pelvis was performed following the standard protocol during bolus administration of intravenous contrast.  CONTRAST:  50 cc of Omni 300.  COMPARISON:  None.  FINDINGS: CT CHEST FINDINGS  Scattered shotty bilateral hilar lymph nodes are noted. No pathologically enlarged mediastinal, hilar, or axillary lymph nodes are identified.  Intrathoracic aorta is of normal caliber and appearance. Scattered calcified plaque present within the aortic arch. No evidence for acute traumatic aortic injury. No mediastinal hematoma.  Heart size is normal.  No pericardial effusion.  Evaluation of the lungs is somewhat limited by motion artifact. Upper lobe predominant centrilobular emphysema present. Atelectatic changes seen dependently within the bilateral lower lobes. No pneumothorax. No focal infiltrates to suggest pulmonary contusion or infection. No pulmonary edema or pleural effusion.  Compression deformity at the superior endplate of T3 is suspicious for possible acute fracture (series 204, image 71). There is approximately 30% of height loss without significant retropulsion. There is an additional compression deformity of the T5 vertebral body with approximately 20-30% of height loss with a retropulsion, also suspicious for acute fracture. No other acute fracture or other osseous abnormality within the thorax.  CT ABDOMEN AND PELVIS FINDINGS  Scattered subcentimeter hypodensities within the spleen are noted, too small the characterize by CT, but may reflect small cyst. A 9 mm  hyperdense lesion within the left hepatic lobe is noted, indeterminate, but likely a small benign hemangioma. Liver is otherwise unremarkable. Gallbladder within normal limits. No biliary dilatation. Spleen is intact. No perisplenic hematoma. Adrenal glands and pancreas are within normal limits.  Kidneys are equal in size with symmetric enhancement. No nephrolithiasis, hydronephrosis, or focal enhancing renal mass.  Stomach within normal limits. No evidence for bowel obstruction or acute bowel injury. No abnormal wall thickening, mucosal enhancement, or inflammatory fat stranding seen about the bowels. Prominent fecal impaction present within the rectal vault.  Bladder intact an within normal limits. Uterus and ovaries are not visualized.  No adenopathy. No free air or fluid. Mild mesenteric edema noted centrally within the abdomen, of uncertain clinical significance.  Moderate atheromatous plaque present within the infrarenal aorta without associated aneurysm. No contrast extravasation. No mesenteric or retroperitoneal hematoma.  No acute fracture within the pelvis. No spinal fracture within the abdomen and pelvis. Degenerative changes noted about the L4-5 intervertebral disc space.  IMPRESSION: 1. Compression deformities of the T3 and T5 vertebral bodies with associated 20-30% of height loss as above, suspicious for acute compression fractures. No significant retropulsion. 2. No other acute traumatic injury within the chest, abdomen, and pelvis. 3.  Prominent fecal impaction within the rectal vault without associated obstruction.   Electronically Signed   By: Jeannine Boga M.D.   On: 03/15/2014 05:38   Ct Cervical Spine Wo Contrast  03/15/2014   CLINICAL DATA:  Fall.  EXAM: CT HEAD WITHOUT CONTRAST  CT CERVICAL SPINE WITHOUT CONTRAST  TECHNIQUE: Multidetector CT imaging of the head and cervical spine was performed following the standard protocol without intravenous contrast. Multiplanar CT image  reconstructions of the cervical spine were also generated.  COMPARISON:  09/04/09  FINDINGS: CT HEAD FINDINGS  Prominence of the sulci and ventricles consistent with brain atrophy. Chronic right basal ganglia lacunar infarct noted. No evidence for acute intracranial hemorrhage, infarct or mass. The paranasal sinuses are clear. The mastoid air cells are clear. The calvarium appears intact.  CT CERVICAL SPINE FINDINGS  Straightening of normal cervical lordosis. The vertebral body heights are well preserved. There is multi level disc space narrowing and ventral endplate spurring compatible with degenerative disc disease. The facet joints are aligned bilaterally. There is a nondisplaced fracture involving the posterior arch of C1 on the left, image 28/series 8. There is a nondisplaced base of dens fracture which extends into the left side of the vertebral body. Comminuted fracture deformity involves the left-sided transverse process of C2 with mild posterior displacement of fracture fragments into the canal of the left vertebral artery. The left posterior arch of C3 fracture is also noted lower cervical spine appears intact.  IMPRESSION: 1. No acute intracranial abnormalities. 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 2. Base of dens fracture which extends into the left side of the vertebral body. 3. Comminuted fracture involving the left transverse process of the C2 vertebra with possible involvement of the vertebral canal 4. C3 Left lamina fracture and C1 left posterior arch fracture, nondisplaced 5. Cervical degenerative disc disease. 6. Critical Value/emergent results were called by telephone at the time of interpretation on 03/15/2014 at 2:01 am to Dr. Artis Delay , who verbally acknowledged these results.   Electronically Signed   By: Kerby Moors M.D.   On: 03/15/2014 02:03   Ct Abdomen Pelvis W Contrast  03/15/2014   CLINICAL DATA:  Initial evaluation for acute trauma. Fall down 10 steps.   EXAM: CT CHEST, ABDOMEN, AND PELVIS WITH CONTRAST  TECHNIQUE: Multidetector CT imaging of the chest, abdomen and pelvis was performed following the standard protocol during bolus administration of intravenous contrast.  CONTRAST:  50 cc of Omni 300.  COMPARISON:  None.  FINDINGS: CT CHEST FINDINGS  Scattered shotty bilateral hilar lymph nodes are noted. No pathologically enlarged mediastinal, hilar, or axillary lymph nodes are identified.  Intrathoracic aorta is of normal caliber and appearance. Scattered calcified plaque present within the aortic arch. No evidence for acute traumatic aortic injury. No mediastinal hematoma.  Heart size is normal.  No pericardial effusion.  Evaluation of the lungs is somewhat limited by motion artifact. Upper lobe predominant centrilobular emphysema present. Atelectatic changes seen dependently within the bilateral lower lobes. No pneumothorax. No focal infiltrates to suggest pulmonary contusion or infection. No pulmonary edema or pleural effusion.  Compression deformity at the superior endplate of T3 is suspicious for possible acute fracture (series 204, image 71). There is approximately 30% of height loss without significant retropulsion. There is an additional compression deformity of the T5 vertebral body with approximately 20-30% of height loss with a retropulsion, also suspicious for acute fracture. No other acute fracture or other osseous abnormality within the thorax.  CT  ABDOMEN AND PELVIS FINDINGS  Scattered subcentimeter hypodensities within the spleen are noted, too small the characterize by CT, but may reflect small cyst. A 9 mm hyperdense lesion within the left hepatic lobe is noted, indeterminate, but likely a small benign hemangioma. Liver is otherwise unremarkable. Gallbladder within normal limits. No biliary dilatation. Spleen is intact. No perisplenic hematoma. Adrenal glands and pancreas are within normal limits.  Kidneys are equal in size with symmetric  enhancement. No nephrolithiasis, hydronephrosis, or focal enhancing renal mass.  Stomach within normal limits. No evidence for bowel obstruction or acute bowel injury. No abnormal wall thickening, mucosal enhancement, or inflammatory fat stranding seen about the bowels. Prominent fecal impaction present within the rectal vault.  Bladder intact an within normal limits. Uterus and ovaries are not visualized.  No adenopathy. No free air or fluid. Mild mesenteric edema noted centrally within the abdomen, of uncertain clinical significance.  Moderate atheromatous plaque present within the infrarenal aorta without associated aneurysm. No contrast extravasation. No mesenteric or retroperitoneal hematoma.  No acute fracture within the pelvis. No spinal fracture within the abdomen and pelvis. Degenerative changes noted about the L4-5 intervertebral disc space.  IMPRESSION: 1. Compression deformities of the T3 and T5 vertebral bodies with associated 20-30% of height loss as above, suspicious for acute compression fractures. No significant retropulsion. 2. No other acute traumatic injury within the chest, abdomen, and pelvis. 3. Prominent fecal impaction within the rectal vault without associated obstruction.   Electronically Signed   By: Jeannine Boga M.D.   On: 03/15/2014 05:38    ROS: Pt is non verbal, rec'd information from chart and her daughter. (pt has been off of aricept for several months.) General:no colds or fevers, no weight changes Skin:no rashes or ulcers HEENT:no blurred vision, no congestion CV:see HPI PUL:see HPI GI:no diarrhea constipation or melena, no indigestion, is incontinent. GU:no hematuria, no dysuria- is urinary incontinent  MS:no joint pain, no claudication Neuro:no syncope, no lightheadedness, non verbal Endo:no diabetes, + thyroid disease   Blood pressure 143/82, pulse 87, temperature 98.2 F (36.8 C), temperature source Oral, resp. rate 20, height _0  (1.676 m), weight  160 lb (72.576 kg), SpO2 95 %. PE: General:Pleasant affect, NAD, does not follow most commands, mild attempt to squeeze hands. Skin:Warm and dry, brisk capillary refill HEENT:normocephalic, sclera clear, mucus membranes moist Neck:supple, no JVD, no bruits  Heart:S1S2 RRR without murmur, gallup, rub or click Lungs:clear, ant without rales, rhonchi, or wheezes EXH:BZJI, non tender, + BS, do not palpate liver spleen or masses Ext:no lower ext edema, 2+ pedal pulses, 2+ radial pulses, cervical collar in place. Neuro:alert does not talk, MAE, follows commands, + facial symmetry    Assessment/Plan Active Problems:   Fall   Cervical spine fracture   Thoracic compression fracture   Frontotemporal dementia   Hypothyroidism   5 sec of ventricular asystole, other times HR at 30 2:1 Block Namenda is at 10 mg;  Her Aricept had been stopped several months ago and she did receive 2 doses here.       [place]  Nurse Practitioner Certified [place] Pager [phone] or after 5pm or weekends call [phone] 03/16/2014, 2:45 PM  I have seen and evaluated the patient this PM along with Cecilie Kicks, NP-C. I have reviewed all available data & was present during the pt/family interview & performed the exam along with Ms. Infold. I agree with her findings, examination as well as impression recommendations.  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   Fall   Cervical spine fracture   Thoracic compression fracture   Frontotemporal dementia   Hypothyroidism    Difficult situation with a 68 y/o woman ~2-3 yrs s/p diagnosis of Frontal-Temporal Dementia.  Is essentially non-verbal & minimally follows commands.  She is able to walk & enjoys music.   She was admitted on 11/25 s/p unwitnessed fall while walking up-stairs.  This is only the ~3rd fall since Dx of dementia (2011 & 2013 that appeared to be mechanical &  witnessed).  She had a C-spine fracture with collar in place.  She has been monitored no Telemetry & was noted to intermittently develop [place] 2nd Deg AVB (in some cases appears to be Wenckebach (Type1), but others appear Type 2.  We were consulted due to a prolonged spell of 3rd Degree AVB of ~5 sec.  This was noted to be ~1348 today.  No real way on knowing if she was actually symptomatic.   She is not very responsive & is unable to follow commands very well - so we have no was on knowing if her fall was related to a similar episode, &/or if she is truly having symptoms.   She was started on Aricept while in-pt (has not been on that @ home for ? 1 yr) which can be have AV Block & bradycardia side effect --> This has been stopped.  What we cannot be sure of is, was her fall related to the bradycardia or not.  If so, we cannot blame Aricept.  We are checking thyroid levels - hypothyroidism can also lead to bradycardia. I have reviewed her medications & do not see other potential culprit medications with well documented bradycardia/AVBlock side effects.  For now, would continue to monitor on Telemetry, stop Aricept (not on other AVN inhibition meds).  As she is asymptomatic & mostly bedbound @ present, would not consider urgent Temp Pacemaker or Permanent Pacemaker at present.  Will forward findings to our EP service (Dr. Caryl Comes is aware) to follow along & provide additional recommendations. The difficult ? Is determining +/- PPM placement if the AVB persists, given her current Neurological state.   MD + NP Time with pt: total 45 min  Minetta Krisher W, M.D., M.S. Interventional Cardiologist   Pager # [phone]

## 2014-03-16 NOTE — Consult Note (Signed)
[facility] Surgery Consult Note  Brandi Schneider October 07, 1945  [id number].    Requesting MD: Dr. Doy Mince Chief Complaint/Reason for Consult: Fall  HPI:  68 y/o female with h/o severe dementia who is nonverbal presents the the [facility] with complaints of a fall which occurred on 03/14/14 at 10:45pm.  A family member noted she fell down 12-14 steps and landed on the hardwood floor with her head under a metal shelf.  She reportedly did not lose consciousness and no seizures.  Pt notes pain to her head and arms.  She was admitted to the medicine service due to her severe dementia.  She was found to have a nondisplaced C1 ring fracture with a tyep III odontoid fracture and nondisplaced linear fracture of the lamina of C3.  She has minor fraccturs of T3 and T5.  Dr. Ronnald Ramp saw the patient and recommended c-collar for 3-6 months, pain control.  Dr. Grandville Silos from hand ortho recommends non-operative management.  No sling/splint required.  Pt is not able to tell me if she is having any pain or any other complaints.  No family at bedside to interview.  Unable to assess precipitating/alleviating factors or radiating symptoms.     ROS: All systems reviewed and otherwise negative except for as above  Family History  Problem Relation Age of Onset  . Breast cancer Mother   . Stroke Father     Past Medical History  Diagnosis Date  . Dementia   . Hypothyroid   . Hypercholesteremia     Past Surgical History  Procedure Laterality Date  . Hysterotomy      Social History:  reports that she has quit smoking. She has never used smokeless tobacco. She reports that she does not drink alcohol or use illicit drugs.  Allergies: No Known Allergies  Medications Prior to Admission  Medication Sig Dispense Refill  . cholecalciferol (VITAMIN D) 1000 UNITS tablet Take 1,000 Units by mouth daily.    . Coconut Oil OIL Take 45 mLs by mouth 3 (three) times daily.    Marland Kitchen levothyroxine (SYNTHROID, LEVOTHROID) 75 MCG  tablet Take 75 mcg by mouth daily before breakfast.    . magnesium 30 MG tablet Take 30 mg by mouth 2 (two) times daily.     . memantine (NAMENDA) 10 MG tablet Take 10 mg by mouth 2 (two) times daily.    . mirabegron ER (MYRBETRIQ) 25 MG TB24 Take 25 mg by mouth daily.    . mirtazapine (REMERON) 15 MG tablet Take 15 mg by mouth at bedtime.    Marland Kitchen OVER THE COUNTER MEDICATION Take 500 mg by mouth daily. "Tumeric"    . donepezil (ARICEPT) 10 MG tablet Take 10 mg by mouth every morning.    . estrogens, conjugated, (PREMARIN) 0.3 MG tablet Take 0.3 mg by mouth daily.    Marland Kitchen MIRTAZAPINE PO Take by mouth.      Blood pressure 151/86, pulse 86, temperature 98.2 F (36.8 C), temperature source Axillary, resp. rate 18, height $RemoveBe'5\' 6"'[id number]$  (1.676 m), weight 160 lb (72.576 kg), SpO2 91 %. Physical Exam: General: nonverbal, grunting, WD/WN white female who is laying in bed in NAD HEENT: head is normocephalic, atraumatic.  Sclera are noninjected.  PERRL.  Ears and nose without any masses or lesions.  Mouth is pink and moist. Heart: regular, rate, and rhythm.  No obvious murmurs, gallops, or rubs noted.  Palpable pedal pulses bilaterally Lungs: CTAB, no wheezes, rhonchi, or rales noted.  Respiratory effort nonlabored Abd: soft, NT/ND, +  BS, no masses, hernias, or organomegaly MS: Neck in c-collar, left elbow with ecchymosis, non-tender, but swollen, distal motor and circulation intact, unable to assess sensation. Skin: warm and dry with no masses, lesions, or rashes Psych: Alert with sternal rub, follows some commands Neuro: not able to assess   Results for orders placed or performed during the hospital encounter of 03/14/14 (from the past 48 hour(s))  CBC with Differential     Status: Abnormal   Collection Time: 03/15/14 12:50 AM  Result Value Ref Range   WBC 9.5 4.0 - 10.5 K/uL   RBC 4.67 3.87 - 5.11 MIL/uL   Hemoglobin 13.5 12.0 - 15.0 g/dL   HCT 41.1 36.0 - 46.0 %   MCV 88.0 78.0 - 100.0 fL   MCH 28.9 26.0  - 34.0 pg   MCHC 32.8 30.0 - 36.0 g/dL   RDW 13.0 11.5 - 15.5 %   Platelets 187 150 - 400 K/uL   Neutrophils Relative % 79 (H) 43 - 77 %   Neutro Abs 7.5 1.7 - 7.7 K/uL   Lymphocytes Relative 13 12 - 46 %   Lymphs Abs 1.2 0.7 - 4.0 K/uL   Monocytes Relative 7 3 - 12 %   Monocytes Absolute 0.6 0.1 - 1.0 K/uL   Eosinophils Relative 1 0 - 5 %   Eosinophils Absolute 0.1 0.0 - 0.7 K/uL   Basophils Relative 0 0 - 1 %   Basophils Absolute 0.0 0.0 - 0.1 K/uL  Basic metabolic panel     Status: Abnormal   Collection Time: 03/15/14 12:50 AM  Result Value Ref Range   Sodium 140 137 - 147 mEq/L   Potassium 4.3 3.7 - 5.3 mEq/L   Chloride 103 96 - 112 mEq/L   CO2 22 19 - 32 mEq/L   Glucose, Bld 147 (H) 70 - 99 mg/dL   BUN 17 6 - 23 mg/dL   Creatinine, Ser 0.65 0.50 - 1.10 mg/dL   Calcium 9.6 8.4 - 10.5 mg/dL   GFR calc non Af Amer 90 (L) >90 mL/min   GFR calc Af Amer >90 >90 mL/min    Comment: (NOTE) The eGFR has been calculated using the CKD EPI equation. This calculation has not been validated in all clinical situations. eGFR's persistently <90 mL/min signify possible Chronic Kidney Disease.    Anion gap 15 5 - 15  Urinalysis, Routine w reflex microscopic     Status: Abnormal   Collection Time: 03/15/14  6:38 PM  Result Value Ref Range   Color, Urine YELLOW YELLOW   APPearance CLOUDY (A) CLEAR   Specific Gravity, Urine 1.027 1.005 - 1.030   pH 6.0 5.0 - 8.0   Glucose, UA NEGATIVE NEGATIVE mg/dL   Hgb urine dipstick NEGATIVE NEGATIVE   Bilirubin Urine NEGATIVE NEGATIVE   Ketones, ur NEGATIVE NEGATIVE mg/dL   Protein, ur NEGATIVE NEGATIVE mg/dL   Urobilinogen, UA 0.2 0.0 - 1.0 mg/dL   Nitrite NEGATIVE NEGATIVE   Leukocytes, UA NEGATIVE NEGATIVE    Comment: MICROSCOPIC NOT DONE ON URINES WITH NEGATIVE PROTEIN, BLOOD, LEUKOCYTES, NITRITE, OR GLUCOSE <1000 mg/dL.  Basic metabolic panel     Status: Abnormal   Collection Time: 03/16/14  4:21 AM  Result Value Ref Range   Sodium 139  137 - 147 mEq/L   Potassium 4.0 3.7 - 5.3 mEq/L   Chloride 103 96 - 112 mEq/L   CO2 22 19 - 32 mEq/L   Glucose, Bld 116 (H) 70 - 99 mg/dL  BUN 11 6 - 23 mg/dL   Creatinine, Ser 0.60 0.50 - 1.10 mg/dL   Calcium 9.3 8.4 - 10.5 mg/dL   GFR calc non Af Amer >90 >90 mL/min   GFR calc Af Amer >90 >90 mL/min    Comment: (NOTE) The eGFR has been calculated using the CKD EPI equation. This calculation has not been validated in all clinical situations. eGFR's persistently <90 mL/min signify possible Chronic Kidney Disease.    Anion gap 14 5 - 15  CBC     Status: None   Collection Time: 03/16/14  4:21 AM  Result Value Ref Range   WBC 6.0 4.0 - 10.5 K/uL   RBC 4.44 3.87 - 5.11 MIL/uL   Hemoglobin 12.6 12.0 - 15.0 g/dL   HCT 38.4 36.0 - 46.0 %   MCV 86.5 78.0 - 100.0 fL   MCH 28.4 26.0 - 34.0 pg   MCHC 32.8 30.0 - 36.0 g/dL   RDW 13.1 11.5 - 15.5 %   Platelets 196 150 - 400 K/uL   Dg Chest 1 View  03/15/2014   CLINICAL DATA:  Fall.  Dementia  EXAM: CHEST - 1 VIEW  COMPARISON:  None  FINDINGS: Normal heart size. There is no pleural effusion identified. Low lung volumes and asymmetric elevation of the right hemidiaphragm noted. Scar versus platelike atelectasis is identified in the left base. There is coarsened interstitial markings noted bilaterally.  IMPRESSION: 1. Left base scar versus atelectasis. 2. Diffuse, chronic appearing interstitial coarsening.   Electronically Signed   By: Kerby Moors M.D.   On: 03/15/2014 01:46   Dg Pelvis 1-2 Views  03/15/2014   CLINICAL DATA:  Initial evaluation for acute trauma.  Fall.  EXAM: PELVIS - 1-2 VIEW  COMPARISON:  None.  FINDINGS: No definite fracture identified. Linear lucency traversing the left pubis symphysis is favored to be chronic in nature or perhaps related to summation of overlying soft tissue shadows. No pelvic bone lesions are seen. SI joints are approximated. Lower lumbar spine grossly unremarkable.  IMPRESSION: 1. Age-indeterminate  linear lucency traversing the left pubis symphysis. While this finding may be chronic in nature or perhaps related to summation of shadows, possible acute fracture is not excluded. Correlation with physical exam for possible fracture at this site recommended. 2. No other acute traumatic injury within the pelvis.   Electronically Signed   By: Jeannine Boga M.D.   On: 03/15/2014 01:51   Dg Elbow Complete Left  03/15/2014   CLINICAL DATA:  Fall.  Elbow laceration.  EXAM: LEFT ELBOW - COMPLETE 3+ VIEW  COMPARISON:  None.  FINDINGS: Cortical offset along the radial neck, only visualized in the frontal projection. Prominent anterior elbow fat pad suggesting small joint effusion. No malalignment.  IMPRESSION: Suspect a nondisplaced radial neck fracture.   Electronically Signed   By: Jorje Guild M.D.   On: 03/15/2014 01:49   Ct Head Wo Contrast  03/15/2014   CLINICAL DATA:  Fall.  EXAM: CT HEAD WITHOUT CONTRAST  CT CERVICAL SPINE WITHOUT CONTRAST  TECHNIQUE: Multidetector CT imaging of the head and cervical spine was performed following the standard protocol without intravenous contrast. Multiplanar CT image reconstructions of the cervical spine were also generated.  COMPARISON:  09/04/09  FINDINGS: CT HEAD FINDINGS  Prominence of the sulci and ventricles consistent with brain atrophy. Chronic right basal ganglia lacunar infarct noted. No evidence for acute intracranial hemorrhage, infarct or mass. The paranasal sinuses are clear. The mastoid air cells are clear. The  calvarium appears intact.  CT CERVICAL SPINE FINDINGS  Straightening of normal cervical lordosis. The vertebral body heights are well preserved. There is multi level disc space narrowing and ventral endplate spurring compatible with degenerative disc disease. The facet joints are aligned bilaterally. There is a nondisplaced fracture involving the posterior arch of C1 on the left, image 28/series 8. There is a nondisplaced base of dens fracture  which extends into the left side of the vertebral body. Comminuted fracture deformity involves the left-sided transverse process of C2 with mild posterior displacement of fracture fragments into the canal of the left vertebral artery. The left posterior arch of C3 fracture is also noted lower cervical spine appears intact.  IMPRESSION: 1. No acute intracranial abnormalities. 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 2. Base of dens fracture which extends into the left side of the vertebral body. 3. Comminuted fracture involving the left transverse process of the C2 vertebra with possible involvement of the vertebral canal 4. C3 Left lamina fracture and C1 left posterior arch fracture, nondisplaced 5. Cervical degenerative disc disease. 6. Critical Value/emergent results were called by telephone at the time of interpretation on 03/15/2014 at 2:01 am to Dr. Artis Delay , who verbally acknowledged these results.   Electronically Signed   By: Kerby Moors M.D.   On: 03/15/2014 02:03   Ct Angio Neck W/cm &/or Wo/cm  03/15/2014   EXAM: CT ANGIOGRAPHY NECK  TECHNIQUE: Multidetector CT imaging of the neck was performed using the standard protocol during bolus administration of intravenous contrast. Multiplanar CT image reconstructions and MIPs were obtained to evaluate the vascular anatomy. Carotid stenosis measurements (when applicable) are obtained utilizing NASCET criteria, using the distal internal carotid diameter as the denominator.  CONTRAST:  41mL OMNIPAQUE IOHEXOL 350 MG/ML SOLN  COMPARISON:  Prior CT of the cervical spine performed earlier on the same day.  FINDINGS: Scattered atheromatous plaque present within the visualize aortic arch which is of normal caliber. Aberrant right subclavian artery noted. No high-grade stenosis seen at the origin of the great vessels. Subclavian arteries well opacified bilaterally.  There is a common origin of the common carotid arteries from the aortic arch.  Common carotid arteries are tortuous in medialized into the mid neck and retropharyngeal space. No hemodynamically significant stenosis or evidence of traumatic injury identified within the common carotid arteries bilaterally. Carotid bifurcations within normal limits.  Internal carotid arteries are well opacified to the skullbase without evidence of dissection, occlusion, or hemodynamically significant stenosis. Visualized portions of the circle of Willis are unremarkable.  External carotid arteries and their branches are within normal limits.  Both vertebral arteries arise from the subclavian arteries. The left vertebral artery is dominant. The right vertebral artery is well opacified along its entire course without evidence of acute traumatic injury, dissection, or stenosis.  Mild mass effect on the left V2/V3 segment as it courses by the fracture fragments at the level of the left transverse foramen. The vertebral artery delete is narrowed by approximately 50%, which may in part be related to Vasa spasm due to the adjacent fracture. This is best seen on sagittal projection (series 404, image 150). No dissection flap or pseudoaneurysm identified. Distally, the left vertebral artery is well opacified to the level of the vertebrobasilar junction.  Previously identified cervical spine fractures again noted, better characterized on prior CT. No other soft tissue abnormality within the neck.  No acute soft tissue abnormality identified within the neck. Thyroid gland within normal limits. Emphysematous changes  noted within the visualized lungs.  IMPRESSION: 1. Focal narrowing of approximately 50% of the left vertebral artery at the level of the left C2 vertebral body fractures. This finding may in part be related to vasospasm due to the adjacent fracture. No frank dissection flap identified. 2. No other acute traumatic injury to the major arterial vasculature of the neck. 3. Stable cervical spine fractures, better  evaluated on prior CT of the cervical spine. Results were called by telephone at the time of interpretation on 03/15/2014 at 4:16 am to Dr. Artis Delay , who verbally acknowledged these results.   Electronically Signed   By: Jeannine Boga M.D.   On: 03/15/2014 04:17   Ct Chest W Contrast  03/15/2014   CLINICAL DATA:  Initial evaluation for acute trauma. Fall down 10 steps.  EXAM: CT CHEST, ABDOMEN, AND PELVIS WITH CONTRAST  TECHNIQUE: Multidetector CT imaging of the chest, abdomen and pelvis was performed following the standard protocol during bolus administration of intravenous contrast.  CONTRAST:  50 cc of Omni 300.  COMPARISON:  None.  FINDINGS: CT CHEST FINDINGS  Scattered shotty bilateral hilar lymph nodes are noted. No pathologically enlarged mediastinal, hilar, or axillary lymph nodes are identified.  Intrathoracic aorta is of normal caliber and appearance. Scattered calcified plaque present within the aortic arch. No evidence for acute traumatic aortic injury. No mediastinal hematoma.  Heart size is normal.  No pericardial effusion.  Evaluation of the lungs is somewhat limited by motion artifact. Upper lobe predominant centrilobular emphysema present. Atelectatic changes seen dependently within the bilateral lower lobes. No pneumothorax. No focal infiltrates to suggest pulmonary contusion or infection. No pulmonary edema or pleural effusion.  Compression deformity at the superior endplate of T3 is suspicious for possible acute fracture (series 204, image 71). There is approximately 30% of height loss without significant retropulsion. There is an additional compression deformity of the T5 vertebral body with approximately 20-30% of height loss with a retropulsion, also suspicious for acute fracture. No other acute fracture or other osseous abnormality within the thorax.  CT ABDOMEN AND PELVIS FINDINGS  Scattered subcentimeter hypodensities within the spleen are noted, too small the characterize  by CT, but may reflect small cyst. A 9 mm hyperdense lesion within the left hepatic lobe is noted, indeterminate, but likely a small benign hemangioma. Liver is otherwise unremarkable. Gallbladder within normal limits. No biliary dilatation. Spleen is intact. No perisplenic hematoma. Adrenal glands and pancreas are within normal limits.  Kidneys are equal in size with symmetric enhancement. No nephrolithiasis, hydronephrosis, or focal enhancing renal mass.  Stomach within normal limits. No evidence for bowel obstruction or acute bowel injury. No abnormal wall thickening, mucosal enhancement, or inflammatory fat stranding seen about the bowels. Prominent fecal impaction present within the rectal vault.  Bladder intact an within normal limits. Uterus and ovaries are not visualized.  No adenopathy. No free air or fluid. Mild mesenteric edema noted centrally within the abdomen, of uncertain clinical significance.  Moderate atheromatous plaque present within the infrarenal aorta without associated aneurysm. No contrast extravasation. No mesenteric or retroperitoneal hematoma.  No acute fracture within the pelvis. No spinal fracture within the abdomen and pelvis. Degenerative changes noted about the L4-5 intervertebral disc space.  IMPRESSION: 1. Compression deformities of the T3 and T5 vertebral bodies with associated 20-30% of height loss as above, suspicious for acute compression fractures. No significant retropulsion. 2. No other acute traumatic injury within the chest, abdomen, and pelvis. 3. Prominent fecal impaction within the rectal  vault without associated obstruction.   Electronically Signed   By: Jeannine Boga M.D.   On: 03/15/2014 05:38   Ct Cervical Spine Wo Contrast  03/15/2014   CLINICAL DATA:  Fall.  EXAM: CT HEAD WITHOUT CONTRAST  CT CERVICAL SPINE WITHOUT CONTRAST  TECHNIQUE: Multidetector CT imaging of the head and cervical spine was performed following the standard protocol without  intravenous contrast. Multiplanar CT image reconstructions of the cervical spine were also generated.  COMPARISON:  09/04/09  FINDINGS: CT HEAD FINDINGS  Prominence of the sulci and ventricles consistent with brain atrophy. Chronic right basal ganglia lacunar infarct noted. No evidence for acute intracranial hemorrhage, infarct or mass. The paranasal sinuses are clear. The mastoid air cells are clear. The calvarium appears intact.  CT CERVICAL SPINE FINDINGS  Straightening of normal cervical lordosis. The vertebral body heights are well preserved. There is multi level disc space narrowing and ventral endplate spurring compatible with degenerative disc disease. The facet joints are aligned bilaterally. There is a nondisplaced fracture involving the posterior arch of C1 on the left, image 28/series 8. There is a nondisplaced base of dens fracture which extends into the left side of the vertebral body. Comminuted fracture deformity involves the left-sided transverse process of C2 with mild posterior displacement of fracture fragments into the canal of the left vertebral artery. The left posterior arch of C3 fracture is also noted lower cervical spine appears intact.  IMPRESSION: 1. No acute intracranial abnormalities. 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 2. Base of dens fracture which extends into the left side of the vertebral body. 3. Comminuted fracture involving the left transverse process of the C2 vertebra with possible involvement of the vertebral canal 4. C3 Left lamina fracture and C1 left posterior arch fracture, nondisplaced 5. Cervical degenerative disc disease. 6. Critical Value/emergent results were called by telephone at the time of interpretation on 03/15/2014 at 2:01 am to Dr. Artis Delay , who verbally acknowledged these results.   Electronically Signed   By: Kerby Moors M.D.   On: 03/15/2014 02:03   Ct Abdomen Pelvis W Contrast  03/15/2014   CLINICAL DATA:  Initial  evaluation for acute trauma. Fall down 10 steps.  EXAM: CT CHEST, ABDOMEN, AND PELVIS WITH CONTRAST  TECHNIQUE: Multidetector CT imaging of the chest, abdomen and pelvis was performed following the standard protocol during bolus administration of intravenous contrast.  CONTRAST:  50 cc of Omni 300.  COMPARISON:  None.  FINDINGS: CT CHEST FINDINGS  Scattered shotty bilateral hilar lymph nodes are noted. No pathologically enlarged mediastinal, hilar, or axillary lymph nodes are identified.  Intrathoracic aorta is of normal caliber and appearance. Scattered calcified plaque present within the aortic arch. No evidence for acute traumatic aortic injury. No mediastinal hematoma.  Heart size is normal.  No pericardial effusion.  Evaluation of the lungs is somewhat limited by motion artifact. Upper lobe predominant centrilobular emphysema present. Atelectatic changes seen dependently within the bilateral lower lobes. No pneumothorax. No focal infiltrates to suggest pulmonary contusion or infection. No pulmonary edema or pleural effusion.  Compression deformity at the superior endplate of T3 is suspicious for possible acute fracture (series 204, image 71). There is approximately 30% of height loss without significant retropulsion. There is an additional compression deformity of the T5 vertebral body with approximately 20-30% of height loss with a retropulsion, also suspicious for acute fracture. No other acute fracture or other osseous abnormality within the thorax.  CT ABDOMEN AND PELVIS FINDINGS  Scattered  subcentimeter hypodensities within the spleen are noted, too small the characterize by CT, but may reflect small cyst. A 9 mm hyperdense lesion within the left hepatic lobe is noted, indeterminate, but likely a small benign hemangioma. Liver is otherwise unremarkable. Gallbladder within normal limits. No biliary dilatation. Spleen is intact. No perisplenic hematoma. Adrenal glands and pancreas are within normal limits.   Kidneys are equal in size with symmetric enhancement. No nephrolithiasis, hydronephrosis, or focal enhancing renal mass.  Stomach within normal limits. No evidence for bowel obstruction or acute bowel injury. No abnormal wall thickening, mucosal enhancement, or inflammatory fat stranding seen about the bowels. Prominent fecal impaction present within the rectal vault.  Bladder intact an within normal limits. Uterus and ovaries are not visualized.  No adenopathy. No free air or fluid. Mild mesenteric edema noted centrally within the abdomen, of uncertain clinical significance.  Moderate atheromatous plaque present within the infrarenal aorta without associated aneurysm. No contrast extravasation. No mesenteric or retroperitoneal hematoma.  No acute fracture within the pelvis. No spinal fracture within the abdomen and pelvis. Degenerative changes noted about the L4-5 intervertebral disc space.  IMPRESSION: 1. Compression deformities of the T3 and T5 vertebral bodies with associated 20-30% of height loss as above, suspicious for acute compression fractures. No significant retropulsion. 2. No other acute traumatic injury within the chest, abdomen, and pelvis. 3. Prominent fecal impaction within the rectal vault without associated obstruction.   Electronically Signed   By: Jeannine Boga M.D.   On: 03/15/2014 05:38      Assessment/Plan Fall down flight of stairs Left radial head fx Cervical/thoracic spine fx Nonverbal Severe dementia   Plan: 1.  Neurosurgery, hand surgery, and medicine following, we'll continue to follow while shes here.   2.  Neurosurgery recommends c-collar for 3-6 mo, non-operative mgt, no bracing to thoracic spine.  OP follow Dr. Saintclair Halsted. 3.  Dr. Grandville Silos (hand surgery) recommends no sling/splint since it is not displaced and she has low discomfort level - recheck in 2 weeks 4.  Pending PT/OT, may be able to d/c soon since she doesn't need anything surgically 5.  Agree with bowel  regimen 6.  Will follow while she's here, but could likely d/c to SNF or home today depending on what PT/OT say. 7.  No other injuries identified.  No trauma follow up needed.   Coralie Keens, [facility] Surgery 03/16/2014, 9:33 AM Pager: [phone]

## 2014-03-16 NOTE — Consult Note (Addendum)
Reason for Consult: high grade AV block   Referring Physician: Dr. Ellyn Hack PCP:  Raeanne Gathers, MD Primary Cardiologist: new  Brandi Schneider is an 68 y.o. female.    Chief Complaint: admitted on the 25th after a fall, now with 5 sec Ventricular asystole, some 2:1 block    HPI:  68 year old female with a history of hypothyroidism, fronto-temporal dementia, presented after a presumed mechanical fall on the evening prior to admission. The patient is nonverbal at baseline, but is able to ambulate without any assistive devices at baseline. This history is obtained from speaking with the patient's daughter at the bedside. Apparently, the patient was trying to walk up stairs when she had a fall down 12-14 steps. There was no loss of consciousness the daughter is aware of. She did not see her mother fall. When the pt was found on the hardwood floor, just after the fall, her head was stuck underneath a metal shelf. EMS was activated, and the patient was brought to emergency. To the daughter's knowledge, Pt has not had any fever, chills, chest pain, sob, n/v/d. She has been eating well. At baseline, pt is incontinent of stool and urine.   In the emergency department, CT of the chest revealed no acute intrathoracic trauma. There was left lower lobe atelectasis. CT of the abdomen and pelvis did not reveal any intra-abdominal trauma or hematoma. CT of the cervical spine revealed a comminuted C2 transverse process fracture as well as a left C3 lamina fracture and left C1 posterior arch fracture. Within the CT of the chest, the patient was also noted to have a T3 vertebral fracture as well as T5 compression fracture with 30% height loss. CT also revealed narrowing of the left vertebral artery of 50% at the level of C2. She remains in her cervical collar ( which she will remain in for 3-6 months). She also has a Lt radial head ND fracture.   EKG shows sinus rhythm with right bundle branch  block. BMP and CBC were unremarkable  Cardiology has been asked to see due to ventricular asystole of 5 sec. Also 2:1 block with HR at 30. Brief episodes. This occurred at 1348 today.  Her lytes are normal with K+ at 4.0.  Will check her Mg+.  TSH is being checked.    Pt had been off Aricept for several months, but was given X 2 doses here. Looking at review of rhythm, only decreased HR was with the block.      No hx of cardiac issues.  Past Medical History  Diagnosis Date  . Dementia   . Hypothyroid   . Hypercholesteremia     Past Surgical History  Procedure Laterality Date  . Hysterotomy      Family History  Problem Relation Age of Onset  . Breast cancer Mother   . Stroke Father    Social History:  reports that she has quit smoking. She has never used smokeless tobacco. She reports that she does not drink alcohol or use illicit drugs.  Allergies: No Known Allergies  Medications Prior to Admission  Medication Sig Dispense Refill  . cholecalciferol (VITAMIN D) 1000 UNITS tablet Take 1,000 Units by mouth daily.    . Coconut Oil OIL Take 45 mLs by mouth 3 (three) times daily.    Marland Kitchen levothyroxine (SYNTHROID, LEVOTHROID) 75 MCG tablet Take 75 mcg by mouth daily before breakfast.    . magnesium 30 MG tablet Take 30 mg  by mouth 2 (two) times daily.     . memantine (NAMENDA) 10 MG tablet Take 10 mg by mouth 2 (two) times daily.    . mirabegron ER (MYRBETRIQ) 25 MG TB24 Take 25 mg by mouth daily.    . mirtazapine (REMERON) 15 MG tablet Take 15 mg by mouth at bedtime.    Marland Kitchen OVER THE COUNTER MEDICATION Take 500 mg by mouth daily. "Tumeric"    . donepezil (ARICEPT) 10 MG tablet Take 10 mg by mouth every morning.    . estrogens, conjugated, (PREMARIN) 0.3 MG tablet Take 0.3 mg by mouth daily.    Marland Kitchen MIRTAZAPINE PO Take by mouth.      Results for orders placed or performed during the hospital encounter of 03/14/14 (from the past 48 hour(s))  CBC with Differential     Status: Abnormal     Collection Time: 03/15/14 12:50 AM  Result Value Ref Range   WBC 9.5 4.0 - 10.5 K/uL   RBC 4.67 3.87 - 5.11 MIL/uL   Hemoglobin 13.5 12.0 - 15.0 g/dL   HCT 41.1 36.0 - 46.0 %   MCV 88.0 78.0 - 100.0 fL   MCH 28.9 26.0 - 34.0 pg   MCHC 32.8 30.0 - 36.0 g/dL   RDW 13.0 11.5 - 15.5 %   Platelets 187 150 - 400 K/uL   Neutrophils Relative % 79 (H) 43 - 77 %   Neutro Abs 7.5 1.7 - 7.7 K/uL   Lymphocytes Relative 13 12 - 46 %   Lymphs Abs 1.2 0.7 - 4.0 K/uL   Monocytes Relative 7 3 - 12 %   Monocytes Absolute 0.6 0.1 - 1.0 K/uL   Eosinophils Relative 1 0 - 5 %   Eosinophils Absolute 0.1 0.0 - 0.7 K/uL   Basophils Relative 0 0 - 1 %   Basophils Absolute 0.0 0.0 - 0.1 K/uL  Basic metabolic panel     Status: Abnormal   Collection Time: 03/15/14 12:50 AM  Result Value Ref Range   Sodium 140 137 - 147 mEq/L   Potassium 4.3 3.7 - 5.3 mEq/L   Chloride 103 96 - 112 mEq/L   CO2 22 19 - 32 mEq/L   Glucose, Bld 147 (H) 70 - 99 mg/dL   BUN 17 6 - 23 mg/dL   Creatinine, Ser 0.65 0.50 - 1.10 mg/dL   Calcium 9.6 8.4 - 10.5 mg/dL   GFR calc non Af Amer 90 (L) >90 mL/min   GFR calc Af Amer >90 >90 mL/min    Comment: (NOTE) The eGFR has been calculated using the CKD EPI equation. This calculation has not been validated in all clinical situations. eGFR's persistently <90 mL/min signify possible Chronic Kidney Disease.    Anion gap 15 5 - 15  Urinalysis, Routine w reflex microscopic     Status: Abnormal   Collection Time: 03/15/14  6:38 PM  Result Value Ref Range   Color, Urine YELLOW YELLOW   APPearance CLOUDY (A) CLEAR   Specific Gravity, Urine 1.027 1.005 - 1.030   pH 6.0 5.0 - 8.0   Glucose, UA NEGATIVE NEGATIVE mg/dL   Hgb urine dipstick NEGATIVE NEGATIVE   Bilirubin Urine NEGATIVE NEGATIVE   Ketones, ur NEGATIVE NEGATIVE mg/dL   Protein, ur NEGATIVE NEGATIVE mg/dL   Urobilinogen, UA 0.2 0.0 - 1.0 mg/dL   Nitrite NEGATIVE NEGATIVE   Leukocytes, UA NEGATIVE NEGATIVE    Comment:  MICROSCOPIC NOT DONE ON URINES WITH NEGATIVE PROTEIN, BLOOD, LEUKOCYTES, NITRITE, OR GLUCOSE <  1000 mg/dL.  Basic metabolic panel     Status: Abnormal   Collection Time: 03/16/14  4:21 AM  Result Value Ref Range   Sodium 139 137 - 147 mEq/L   Potassium 4.0 3.7 - 5.3 mEq/L   Chloride 103 96 - 112 mEq/L   CO2 22 19 - 32 mEq/L   Glucose, Bld 116 (H) 70 - 99 mg/dL   BUN 11 6 - 23 mg/dL   Creatinine, Ser 0.60 0.50 - 1.10 mg/dL   Calcium 9.3 8.4 - 10.5 mg/dL   GFR calc non Af Amer >90 >90 mL/min   GFR calc Af Amer >90 >90 mL/min    Comment: (NOTE) The eGFR has been calculated using the CKD EPI equation. This calculation has not been validated in all clinical situations. eGFR's persistently <90 mL/min signify possible Chronic Kidney Disease.    Anion gap 14 5 - 15  CBC     Status: None   Collection Time: 03/16/14  4:21 AM  Result Value Ref Range   WBC 6.0 4.0 - 10.5 K/uL   RBC 4.44 3.87 - 5.11 MIL/uL   Hemoglobin 12.6 12.0 - 15.0 g/dL   HCT 38.4 36.0 - 46.0 %   MCV 86.5 78.0 - 100.0 fL   MCH 28.4 26.0 - 34.0 pg   MCHC 32.8 30.0 - 36.0 g/dL   RDW 13.1 11.5 - 15.5 %   Platelets 196 150 - 400 K/uL     History  Substance Use Topics  . Smoking status: Former Research scientist (life sciences)  . Smokeless tobacco: Never Used  . Alcohol Use: No      ROS: Pt is non verbal,  Info is from daughter and chart.   General:no colds or fevers, no weight changes Skin:no rashes or ulcers HEENT:no blurred vision, no congestion CV:see HPI PUL:see HPI GI:no diarrhea constipation or melena, no indigestion GU:no hematuria, no dysuria MS:no joint pain, no claudication Neuro:no syncope, no lightheadedness, pt was awake with blue lips after fall Endo:no diabetes, + thyroid disease   Blood pressure 143/82, pulse 87, temperature 98.2 F (36.8 C), temperature source Oral, resp. rate 20, height _0  (1.676 m), weight 160 lb (72.576 kg), SpO2 95 %. PE: General:Pleasant affect, NAD, non verbal, daughter at bedside answering  questions Skin:Warm and dry, brisk capillary refill HEENT:normocephalic, sclera clear, mucus membranes moist Neck:cervical collar in place  Heart:S1S2 RRR without murmur, gallup, rub or click Lungs: ant lung sounds clear without rales, rhonchi, or wheezes NOM:VEHM, non tender, + BS, do not palpate liver spleen or masses Ext:no lower ext edema, 2+ pedal pulses, 2+ radial pulses Neuro:alert follows minimal commands , + facial symmetry    Assessment/Plan 5 sec of ventricular asystole, other times HR at 30 2:1 Block.    Namenda is at 10 mg; Her Aricept had been stopped several months ago and she did receive 2 doses here. TSH free t4 pending.  No longer on Aricept here in hospital.  Active Problems:   Fall   Cervical spine fracture   Thoracic compression fracture   Frontotemporal dementia   Hypothyroidism   Complete heart block   2Nd degree AV block - most consistent with Mobitz Type 2   Right bundle branch block.   [place]  Nurse Practitioner Certified [place] Pager [phone] or after 5pm or weekends call [phone] 03/16/2014, 4:14 PM     I think we presume that intermittent complete haert block is the cause of her fall and syncope   She  should be probably be paced if she is going to remain ambulatory.  Donepezil is reported to be associated with complete heart block and issues normalize over days  An alternative strategy would be to place a loop recorder.  The presence of underlying right bundle branch block is associated with a higher risk of complete heart block in patients withsyncope and long term monitoring  I will review this with her daughter when she arrives and anticpate device implant (pacer or loop)  Monday will follow telemtry  Nothing of note overnight

## 2014-03-17 DIAGNOSIS — IMO0002 Reserved for concepts with insufficient information to code with codable children: Secondary | ICD-10-CM | POA: Insufficient documentation

## 2014-03-17 DIAGNOSIS — E038 Other specified hypothyroidism: Secondary | ICD-10-CM

## 2014-03-17 DIAGNOSIS — I442 Atrioventricular block, complete: Secondary | ICD-10-CM

## 2014-03-17 DIAGNOSIS — T50905A Adverse effect of unspecified drugs, medicaments and biological substances, initial encounter: Secondary | ICD-10-CM

## 2014-03-17 LAB — GLUCOSE, CAPILLARY: GLUCOSE-CAPILLARY: 119 mg/dL — AB (ref 70–99)

## 2014-03-17 LAB — T4, FREE: FREE T4: 0.93 ng/dL (ref 0.80–1.80)

## 2014-03-17 NOTE — Progress Notes (Signed)
Physical Therapy Treatment Patient Details Name: Brandi Schneider: [id number] DOB: 1946-03-17 Today's Date: 03/17/2014    History of Present Illness Adm 03/15/14 s/p fall (unwitnessed) down 12 indoor steps. Pt with C1-3 fractures and T3 & T5 compression fractures (stable, non-surgical). Lt radial neck fracture with ortho stating stable and no splint or sling required. PMHx- non-verbal due to dementia; intrinsic hand muscle wasting    PT Comments    Provided education to Brandi Schneider and Brandi Schneider on bed mobility and gait.  Brandi Schneider able to assist patient with min verbal cuing.  Patient did well with mobility and gait.  Follow Up Recommendations  Home health PT;Supervision/Assistance - 24 hour     Equipment Recommendations  Hospital bed    Recommendations for Other Services       Precautions / Restrictions Precautions Precautions: Cervical;Fall Required Braces or Orthoses: Cervical Brace Cervical Brace: Hard collar;At all times Restrictions Weight Bearing Restrictions: No    Mobility  Bed Mobility Overal bed mobility: Needs Assistance Bed Mobility: Rolling;Sidelying to Sit;Sit to Sidelying Rolling: Min assist Sidelying to sit: Mod assist     Sit to sidelying: Mod assist General bed mobility comments: Instructed daugher and Brandi Schneider on step-by-step technique for bed mobility.  Brandi Schneider assisted patient wtih rolling.  Brandi Schneider assisted with sidelying to sitting, with instructions on hand placement.  Patient provided wtih cuing and assist to move LE's off of bed and raise trunk to sitting.  Once upright, patient able to maintain balance with min guard assist.  Brandi Schneider assisted patient back to sidelying with verbal cues for hand placement.  Transfers Overall transfer level: Needs assistance Equipment used: 1 person hand held assist Transfers: Sit to/from Stand Sit to Stand: Min assist         General transfer comment: Verbal cues for Brandi Schneider for correct positioning and  use of gait belt.  Verbal and visual cues for patient to stand.  Required min assist from bed and BSC.    Ambulation/Gait Ambulation/Gait assistance: Min assist Ambulation Distance (Feet): 200 Feet Assistive device: 1 person hand held assist Gait Pattern/deviations: Step-through pattern;Decreased step length - right;Decreased step length - left;Decreased stride length;Wide base of support Gait velocity: Decreased Gait velocity interpretation: Below normal speed for age/gender General Gait Details: Instructed Brandi Schneider on proper way to assist patient with gait.  Verbal and visual cues to initiate gait.  Patient wtih fairly good balance, requiring min assist for safety.  During gait, patient incontinent of bowel.  [facility] and then cleaned.   Stairs            Wheelchair Mobility    Modified Rankin (Stroke Patients Only)       Balance                                    Cognition Arousal/Alertness: Awake/alert Behavior During Therapy: Flat affect Overall Cognitive Status: History of cognitive impairments - at baseline                      Exercises      General Comments        Pertinent Vitals/Pain Pain Assessment:  (PAINAD score = 1) Pain Descriptors / Indicators:  (1 = Body tense) Pain Intervention(s): Monitored during session    Home Living                      Prior  Function            PT Goals (current goals can now be found in the care plan section) Progress towards PT goals: Progressing toward goals    Frequency  Min 5X/week    PT Plan Current plan remains appropriate;Equipment recommendations need to be updated    Co-evaluation             End of Session Equipment Utilized During Treatment: Gait belt;Cervical collar Activity Tolerance: Patient tolerated treatment well Patient left: in bed;with call bell/phone within reach;with family/visitor present     Time: [id number] PT Time Calculation  (min) (ACUTE ONLY): 32 min  Charges:  $Gait Training: 8-22 mins $Therapeutic Activity: 8-22 mins                    G Codes:      Brandi Schneider, Brandi Schneider 03/17/2014, 2:07 PM Brandi Schneider, PT, [facility] Pager [phone]

## 2014-03-17 NOTE — Progress Notes (Signed)
PATIENT DETAILS Name: Brandi Schneider Age: 68 y.o. Sex: female Date of Birth: 09/04/1945 Admit Date: 03/14/2014 Admitting Physician Eduard Clos, MD ZOX:[id number], Jill Side, MD  Subjective: Non verbal-follows some commands, tracks my movement with eyes  Assessment/Plan: Active Problems:  Mechanical fall: No syncope. However on telemetry monitoring was found to have a 5.8 second pause followed by a brief period of complete heart block. Suspect mechanical fall could be from this.    Base of dens fracture/Comminuted fracture of left transverse process of C3/C3 Left lamina fracture and C1 left posterior arch fracture/acute compression fracture of T3/T5 vertebral bodies: Secondary to a mechanical fall. Seen by neurosurgery, not a surgical candidate, recommendations are pain control, mobilize as tolerated, and remain in a cervical collar for 3-6 months. Outpatient follow-up with Dr. Wynetta Emery. PT is recommending home health PT.  5.8 second sinus pause with brief period of complete heart block: Occurred on 11/27, seen by cardiology/EPS. Cardiology to discuss with family and place either a loop recorder or a permanent pacemaker on Monday. No major events on telemetry overnight.Aricept stopped.    Nondisplaced left radial neck fracture: Seen by hand surgeon/orthopedics, okay to leave out of sling or splint. Follow-up with Dr. Janee Morn in 2 weeks for recheck.    Fecal impaction: Numerous BM's yesterday with  Fleet enema 1, continue on oral MiraLAX/Senokot.    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 Follow some commands and will squeeze my hands. Continue Namenda, have stopped Aricept given Sinus pause/Complete Heart Block.    Hypothyroidism: Continue levothyroxine.  Disposition: Remain inpatient- home health PT on discharge  Antibiotics:  Anti-infectives    None     DVT Prophylaxis: Prophylactic Heparin   Code Status: Full code  Family Communication None at  bedside-spoke with  daughter Merwyn Katos [phone] on 11/27  Procedures:  None  CONSULTS:  Cardiology  Orthopedics  Neurosurgery  Time spent 40 minutes-which includes 50% of the time with face-to-face with patient/ family and coordinating care related to the above assessment and plan.  MEDICATIONS: Scheduled Meds: . cholecalciferol  1,000 Units Oral Daily  . estrogens (conjugated)  0.3 mg Oral Daily  . heparin  5,000 Units Subcutaneous 3 times per day  . levothyroxine  75 mcg Oral QAC breakfast  . memantine  10 mg Oral BID  . mirabegron ER  25 mg Oral Daily  . mirtazapine  15 mg Oral QHS  . polyethylene glycol  17 g Oral Daily  . senna-docusate  2 tablet Oral BID  . sodium chloride  3 mL Intravenous Q12H   Continuous Infusions:  PRN Meds:.acetaminophen **OR** acetaminophen, HYDROcodone-acetaminophen, mineral oil, RESOURCE THICKENUP CLEAR    PHYSICAL EXAM: Vital signs in last 24 hours: Filed Vitals:   03/16/14 1808 03/16/14 2123 03/17/14 0152 03/17/14 0649  BP: 119/78 127/75 139/71 145/81  Pulse: 88 83 82 83  Temp: 98.2 F (36.8 C) 98.5 F (36.9 C) 98.8 F (37.1 C) 98.6 F (37 C)  TempSrc: Oral Oral Axillary Axillary  Resp: 20 20 20 20   Height:      Weight:      SpO2: 96% 96% 94% 95%    Weight change:  Filed Weights   03/15/14 0004  Weight: 72.576 kg (160 lb)   Body mass index is 25.84 kg/(m^2).   Gen Exam: Awake, non verbal at baseline. Follows some commands Neck: Supple, No JVD.  Cervical collar in place Chest: B/L Clear.   CVS: S1 S2  Regular, no murmurs.  Abdomen: soft, BS +, non tender, non distended.  Extremities: no edema, lower extremities warm to touch. Neurologic: Gen weakness-but seems to move all 4 ext Skin: No Rash.   Wounds: N/A.    Intake/Output from previous day: No intake or output data in the 24 hours ending 03/17/14 1348   LAB RESULTS: CBC  Recent Labs Lab 03/15/14 0050 03/16/14 0421  WBC 9.5 6.0  HGB 13.5  12.6  HCT 41.1 38.4  PLT 187 196  MCV 88.0 86.5  MCH 28.9 28.4  MCHC 32.8 32.8  RDW 13.0 13.1  LYMPHSABS 1.2  --   MONOABS 0.6  --   EOSABS 0.1  --   BASOSABS 0.0  --     Chemistries   Recent Labs Lab 03/15/14 0050 03/16/14 0421 03/16/14 1530  NA 140 139  --   K 4.3 4.0  --   CL 103 103  --   CO2 22 22  --   GLUCOSE 147* 116*  --   BUN 17 11  --   CREATININE 0.65 0.60  --   CALCIUM 9.6 9.3  --   MG  --   --  2.1    CBG:  Recent Labs Lab 03/16/14 2118 03/17/14 0644  GLUCAP 104* 119*    GFR Estimated Creatinine Clearance: 69.6 mL/min (by C-G formula based on Cr of 0.6).  Coagulation profile No results for input(s): INR, PROTIME in the last 168 hours.  Cardiac Enzymes No results for input(s): CKMB, TROPONINI, MYOGLOBIN in the last 168 hours.  Invalid input(s): CK  Invalid input(s): POCBNP No results for input(s): DDIMER in the last 72 hours. No results for input(s): HGBA1C in the last 72 hours. No results for input(s): CHOL, HDL, LDLCALC, TRIG, CHOLHDL, LDLDIRECT in the last 72 hours.  Recent Labs  03/16/14 1534  TSH 2.240   No results for input(s): VITAMINB12, FOLATE, FERRITIN, TIBC, IRON, RETICCTPCT in the last 72 hours. No results for input(s): LIPASE, AMYLASE in the last 72 hours.  Urine Studies No results for input(s): UHGB, CRYS in the last 72 hours.  Invalid input(s): UACOL, UAPR, USPG, UPH, UTP, UGL, UKET, UBIL, UNIT, UROB, ULEU, UEPI, UWBC, URBC, UBAC, CAST, UCOM, BILUA  MICROBIOLOGY: Recent Results (from the past 240 hour(s))  Urine culture     Status: None   Collection Time: 03/15/14  6:38 PM  Result Value Ref Range Status   Specimen Description URINE, CATHETERIZED  Final   Special Requests NONE  Final   Culture  Setup Time   Final    03/16/2014 04:19 Performed at [facility]    Colony Count NO GROWTH Performed at [facility]   Final   Culture NO GROWTH Performed at [facility]   Final   Report  Status 03/16/2014 FINAL  Final    RADIOLOGY STUDIES/RESULTS: Dg Chest 1 View  03/15/2014   CLINICAL DATA:  Fall.  Dementia  EXAM: CHEST - 1 VIEW  COMPARISON:  None  FINDINGS: Normal heart size. There is no pleural effusion identified. Low lung volumes and asymmetric elevation of the right hemidiaphragm noted. Scar versus platelike atelectasis is identified in the left base. There is coarsened interstitial markings noted bilaterally.  IMPRESSION: 1. Left base scar versus atelectasis. 2. Diffuse, chronic appearing interstitial coarsening.   Electronically Signed   By: Signa Kell M.D.   On: 03/15/2014 01:46   Dg Pelvis 1-2 Views  03/15/2014   CLINICAL DATA:  Initial evaluation for  acute trauma.  Fall.  EXAM: PELVIS - 1-2 VIEW  COMPARISON:  None.  FINDINGS: No definite fracture identified. Linear lucency traversing the left pubis symphysis is favored to be chronic in nature or perhaps related to summation of overlying soft tissue shadows. No pelvic bone lesions are seen. SI joints are approximated. Lower lumbar spine grossly unremarkable.  IMPRESSION: 1. Age-indeterminate linear lucency traversing the left pubis symphysis. While this finding may be chronic in nature or perhaps related to summation of shadows, possible acute fracture is not excluded. Correlation with physical exam for possible fracture at this site recommended. 2. No other acute traumatic injury within the pelvis.   Electronically Signed   By: Rise MuBenjamin  McClintock M.D.   On: 03/15/2014 01:51   Dg Elbow Complete Left  03/15/2014   CLINICAL DATA:  Fall.  Elbow laceration.  EXAM: LEFT ELBOW - COMPLETE 3+ VIEW  COMPARISON:  None.  FINDINGS: Cortical offset along the radial neck, only visualized in the frontal projection. Prominent anterior elbow fat pad suggesting small joint effusion. No malalignment.  IMPRESSION: Suspect a nondisplaced radial neck fracture.   Electronically Signed   By: Tiburcio PeaJonathan  Watts M.D.   On: 03/15/2014 01:49   Ct  Head Wo Contrast  03/15/2014   CLINICAL DATA:  Fall.  EXAM: CT HEAD WITHOUT CONTRAST  CT CERVICAL SPINE WITHOUT CONTRAST  TECHNIQUE: Multidetector CT imaging of the head and cervical spine was performed following the standard protocol without intravenous contrast. Multiplanar CT image reconstructions of the cervical spine were also generated.  COMPARISON:  09/04/09  FINDINGS: CT HEAD FINDINGS  Prominence of the sulci and ventricles consistent with brain atrophy. Chronic right basal ganglia lacunar infarct noted. No evidence for acute intracranial hemorrhage, infarct or mass. The paranasal sinuses are clear. The mastoid air cells are clear. The calvarium appears intact.  CT CERVICAL SPINE FINDINGS  Straightening of normal cervical lordosis. The vertebral body heights are well preserved. There is multi level disc space narrowing and ventral endplate spurring compatible with degenerative disc disease. The facet joints are aligned bilaterally. There is a nondisplaced fracture involving the posterior arch of C1 on the left, image 28/series 8. There is a nondisplaced base of dens fracture which extends into the left side of the vertebral body. Comminuted fracture deformity involves the left-sided transverse process of C2 with mild posterior displacement of fracture fragments into the canal of the left vertebral artery. The left posterior arch of C3 fracture is also noted lower cervical spine appears intact.  IMPRESSION: 1. No acute intracranial abnormalities. 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 2. Base of dens fracture which extends into the left side of the vertebral body. 3. Comminuted fracture involving the left transverse process of the C2 vertebra with possible involvement of the vertebral canal 4. C3 Left lamina fracture and C1 left posterior arch fracture, nondisplaced 5. Cervical degenerative disc disease. 6. Critical Value/emergent results were called by telephone at the time of  interpretation on 03/15/2014 at 2:01 am to Dr. Warnell ForesterREY WOFFORD , who verbally acknowledged these results.   Electronically Signed   By: Signa Kellaylor  Stroud M.D.   On: 03/15/2014 02:03   Ct Angio Neck W/cm &/or Wo/cm  03/15/2014   EXAM: CT ANGIOGRAPHY NECK  TECHNIQUE: Multidetector CT imaging of the neck was performed using the standard protocol during bolus administration of intravenous contrast. Multiplanar CT image reconstructions and MIPs were obtained to evaluate the vascular anatomy. Carotid stenosis measurements (when applicable) are obtained utilizing NASCET criteria, using  the distal internal carotid diameter as the denominator.  CONTRAST:  50mL OMNIPAQUE IOHEXOL 350 MG/ML SOLN  COMPARISON:  Prior CT of the cervical spine performed earlier on the same day.  FINDINGS: Scattered atheromatous plaque present within the visualize aortic arch which is of normal caliber. Aberrant right subclavian artery noted. No high-grade stenosis seen at the origin of the great vessels. Subclavian arteries well opacified bilaterally.  There is a common origin of the common carotid arteries from the aortic arch. Common carotid arteries are tortuous in medialized into the mid neck and retropharyngeal space. No hemodynamically significant stenosis or evidence of traumatic injury identified within the common carotid arteries bilaterally. Carotid bifurcations within normal limits.  Internal carotid arteries are well opacified to the skullbase without evidence of dissection, occlusion, or hemodynamically significant stenosis. Visualized portions of the circle of Willis are unremarkable.  External carotid arteries and their branches are within normal limits.  Both vertebral arteries arise from the subclavian arteries. The left vertebral artery is dominant. The right vertebral artery is well opacified along its entire course without evidence of acute traumatic injury, dissection, or stenosis.  Mild mass effect on the left V2/V3 segment as  it courses by the fracture fragments at the level of the left transverse foramen. The vertebral artery delete is narrowed by approximately 50%, which may in part be related to Vasa spasm due to the adjacent fracture. This is best seen on sagittal projection (series 404, image 150). No dissection flap or pseudoaneurysm identified. Distally, the left vertebral artery is well opacified to the level of the vertebrobasilar junction.  Previously identified cervical spine fractures again noted, better characterized on prior CT. No other soft tissue abnormality within the neck.  No acute soft tissue abnormality identified within the neck. Thyroid gland within normal limits. Emphysematous changes noted within the visualized lungs.  IMPRESSION: 1. Focal narrowing of approximately 50% of the left vertebral artery at the level of the left C2 vertebral body fractures. This finding may in part be related to vasospasm due to the adjacent fracture. No frank dissection flap identified. 2. No other acute traumatic injury to the major arterial vasculature of the neck. 3. Stable cervical spine fractures, better evaluated on prior CT of the cervical spine. Results were called by telephone at the time of interpretation on 03/15/2014 at 4:16 am to Dr. Warnell ForesterREY WOFFORD , who verbally acknowledged these results.   Electronically Signed   By: Rise MuBenjamin  McClintock M.D.   On: 03/15/2014 04:17   Ct Chest W Contrast  03/15/2014   CLINICAL DATA:  Initial evaluation for acute trauma. Fall down 10 steps.  EXAM: CT CHEST, ABDOMEN, AND PELVIS WITH CONTRAST  TECHNIQUE: Multidetector CT imaging of the chest, abdomen and pelvis was performed following the standard protocol during bolus administration of intravenous contrast.  CONTRAST:  50 cc of Omni 300.  COMPARISON:  None.  FINDINGS: CT CHEST FINDINGS  Scattered shotty bilateral hilar lymph nodes are noted. No pathologically enlarged mediastinal, hilar, or axillary lymph nodes are identified.   Intrathoracic aorta is of normal caliber and appearance. Scattered calcified plaque present within the aortic arch. No evidence for acute traumatic aortic injury. No mediastinal hematoma.  Heart size is normal.  No pericardial effusion.  Evaluation of the lungs is somewhat limited by motion artifact. Upper lobe predominant centrilobular emphysema present. Atelectatic changes seen dependently within the bilateral lower lobes. No pneumothorax. No focal infiltrates to suggest pulmonary contusion or infection. No pulmonary edema or pleural effusion.  Compression  deformity at the superior endplate of T3 is suspicious for possible acute fracture (series 204, image 71). There is approximately 30% of height loss without significant retropulsion. There is an additional compression deformity of the T5 vertebral body with approximately 20-30% of height loss with a retropulsion, also suspicious for acute fracture. No other acute fracture or other osseous abnormality within the thorax.  CT ABDOMEN AND PELVIS FINDINGS  Scattered subcentimeter hypodensities within the spleen are noted, too small the characterize by CT, but may reflect small cyst. A 9 mm hyperdense lesion within the left hepatic lobe is noted, indeterminate, but likely a small benign hemangioma. Liver is otherwise unremarkable. Gallbladder within normal limits. No biliary dilatation. Spleen is intact. No perisplenic hematoma. Adrenal glands and pancreas are within normal limits.  Kidneys are equal in size with symmetric enhancement. No nephrolithiasis, hydronephrosis, or focal enhancing renal mass.  Stomach within normal limits. No evidence for bowel obstruction or acute bowel injury. No abnormal wall thickening, mucosal enhancement, or inflammatory fat stranding seen about the bowels. Prominent fecal impaction present within the rectal vault.  Bladder intact an within normal limits. Uterus and ovaries are not visualized.  No adenopathy. No free air or fluid. Mild  mesenteric edema noted centrally within the abdomen, of uncertain clinical significance.  Moderate atheromatous plaque present within the infrarenal aorta without associated aneurysm. No contrast extravasation. No mesenteric or retroperitoneal hematoma.  No acute fracture within the pelvis. No spinal fracture within the abdomen and pelvis. Degenerative changes noted about the L4-5 intervertebral disc space.  IMPRESSION: 1. Compression deformities of the T3 and T5 vertebral bodies with associated 20-30% of height loss as above, suspicious for acute compression fractures. No significant retropulsion. 2. No other acute traumatic injury within the chest, abdomen, and pelvis. 3. Prominent fecal impaction within the rectal vault without associated obstruction.   Electronically Signed   By: Rise Mu M.D.   On: 03/15/2014 05:38   Ct Cervical Spine Wo Contrast  03/15/2014   CLINICAL DATA:  Fall.  EXAM: CT HEAD WITHOUT CONTRAST  CT CERVICAL SPINE WITHOUT CONTRAST  TECHNIQUE: Multidetector CT imaging of the head and cervical spine was performed following the standard protocol without intravenous contrast. Multiplanar CT image reconstructions of the cervical spine were also generated.  COMPARISON:  09/04/09  FINDINGS: CT HEAD FINDINGS  Prominence of the sulci and ventricles consistent with brain atrophy. Chronic right basal ganglia lacunar infarct noted. No evidence for acute intracranial hemorrhage, infarct or mass. The paranasal sinuses are clear. The mastoid air cells are clear. The calvarium appears intact.  CT CERVICAL SPINE FINDINGS  Straightening of normal cervical lordosis. The vertebral body heights are well preserved. There is multi level disc space narrowing and ventral endplate spurring compatible with degenerative disc disease. The facet joints are aligned bilaterally. There is a nondisplaced fracture involving the posterior arch of C1 on the left, image 28/series 8. There is a nondisplaced base of  dens fracture which extends into the left side of the vertebral body. Comminuted fracture deformity involves the left-sided transverse process of C2 with mild posterior displacement of fracture fragments into the canal of the left vertebral artery. The left posterior arch of C3 fracture is also noted lower cervical spine appears intact.  IMPRESSION: 1. No acute intracranial abnormalities. 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 2. Base of dens fracture which extends into the left side of the vertebral body. 3. Comminuted fracture involving the left transverse process of the C2 vertebra with possible  involvement of the vertebral canal 4. C3 Left lamina fracture and C1 left posterior arch fracture, nondisplaced 5. Cervical degenerative disc disease. 6. Critical Value/emergent results were called by telephone at the time of interpretation on 03/15/2014 at 2:01 am to Dr. Warnell Forester , who verbally acknowledged these results.   Electronically Signed   By: Signa Kell M.D.   On: 03/15/2014 02:03   Ct Abdomen Pelvis W Contrast  03/15/2014   CLINICAL DATA:  Initial evaluation for acute trauma. Fall down 10 steps.  EXAM: CT CHEST, ABDOMEN, AND PELVIS WITH CONTRAST  TECHNIQUE: Multidetector CT imaging of the chest, abdomen and pelvis was performed following the standard protocol during bolus administration of intravenous contrast.  CONTRAST:  50 cc of Omni 300.  COMPARISON:  None.  FINDINGS: CT CHEST FINDINGS  Scattered shotty bilateral hilar lymph nodes are noted. No pathologically enlarged mediastinal, hilar, or axillary lymph nodes are identified.  Intrathoracic aorta is of normal caliber and appearance. Scattered calcified plaque present within the aortic arch. No evidence for acute traumatic aortic injury. No mediastinal hematoma.  Heart size is normal.  No pericardial effusion.  Evaluation of the lungs is somewhat limited by motion artifact. Upper lobe predominant centrilobular emphysema  present. Atelectatic changes seen dependently within the bilateral lower lobes. No pneumothorax. No focal infiltrates to suggest pulmonary contusion or infection. No pulmonary edema or pleural effusion.  Compression deformity at the superior endplate of T3 is suspicious for possible acute fracture (series 204, image 71). There is approximately 30% of height loss without significant retropulsion. There is an additional compression deformity of the T5 vertebral body with approximately 20-30% of height loss with a retropulsion, also suspicious for acute fracture. No other acute fracture or other osseous abnormality within the thorax.  CT ABDOMEN AND PELVIS FINDINGS  Scattered subcentimeter hypodensities within the spleen are noted, too small the characterize by CT, but may reflect small cyst. A 9 mm hyperdense lesion within the left hepatic lobe is noted, indeterminate, but likely a small benign hemangioma. Liver is otherwise unremarkable. Gallbladder within normal limits. No biliary dilatation. Spleen is intact. No perisplenic hematoma. Adrenal glands and pancreas are within normal limits.  Kidneys are equal in size with symmetric enhancement. No nephrolithiasis, hydronephrosis, or focal enhancing renal mass.  Stomach within normal limits. No evidence for bowel obstruction or acute bowel injury. No abnormal wall thickening, mucosal enhancement, or inflammatory fat stranding seen about the bowels. Prominent fecal impaction present within the rectal vault.  Bladder intact an within normal limits. Uterus and ovaries are not visualized.  No adenopathy. No free air or fluid. Mild mesenteric edema noted centrally within the abdomen, of uncertain clinical significance.  Moderate atheromatous plaque present within the infrarenal aorta without associated aneurysm. No contrast extravasation. No mesenteric or retroperitoneal hematoma.  No acute fracture within the pelvis. No spinal fracture within the abdomen and pelvis.  Degenerative changes noted about the L4-5 intervertebral disc space.  IMPRESSION: 1. Compression deformities of the T3 and T5 vertebral bodies with associated 20-30% of height loss as above, suspicious for acute compression fractures. No significant retropulsion. 2. No other acute traumatic injury within the chest, abdomen, and pelvis. 3. Prominent fecal impaction within the rectal vault without associated obstruction.   Electronically Signed   By: Rise Mu M.D.   On: 03/15/2014 05:38    Jeoffrey Massed, MD  [facility] Pager:[phone]  If 7PM-7AM, please contact night-coverage [email or web address] Password [email or web address] 03/17/2014, 1:48 PM   LOS: 3 days

## 2014-03-17 NOTE — Evaluation (Signed)
Occupational Therapy Evaluation and Discharge Patient Details Name: Brandi Schneider MRN: [id number] DOB: Dec 16, 1945 Today's Date: 03/17/2014    History of Present Illness Adm 03/15/14 s/p fall (unwitnessed) down 12 indoor steps. Pt with C1-3 fractures and T3 & T5 compression fractures (stable, non-surgical). Lt radial neck fracture with ortho stating stable and no splint or sling required. PMHx- non-verbal due to dementia; intrinsic hand muscle wasting   Clinical Impression   This 68 yo female admitted with above presents to acute with all BADL education completed with family, they do not have any further ADL questions, we will sign off.    Follow Up Recommendations  No OT follow up    Equipment Recommendations  Hospital bed       Precautions / Restrictions Precautions Precautions: Cervical;Fall Required Braces or Orthoses: Cervical Brace Cervical Brace: Hard collar;At all times Restrictions Weight Bearing Restrictions: No              ADL                                         General ADL Comments: Daughter reports she was helping pt with B/D, that pt would try things every now then, or ie: hold out an arm so daughter could put shirt on, but overall daughter does her ADLs. She reports that she does self feed as long as someone is there to remind her what she is doing. I went over wear, care and adjustment of c-collar as well as had nursing secretary order a new set of pads.Advised daughter for pt to wear wide collared or v-neck shirts OR button up the front shirts for ease with c-collar.               Pertinent Vitals/Pain Pain Assessment: Faces Pain Score: 0-No pain      Hand Dominance Right   Extremity/Trunk Assessment Upper Extremity Assessment Upper Extremity Assessment: Generalized weakness           Communication Communication Communication: Expressive difficulties;Receptive difficulties   Cognition Arousal/Alertness:  Awake/alert Behavior During Therapy: Flat affect Overall Cognitive Status: History of cognitive impairments - at baseline                                Home Living Family/patient expects to be discharged to:: Private residence Living Arrangements: Spouse/significant other;Children Available Help at Discharge: Family;Available 24 hours/day Type of Home: House       Home Layout: Two level;Bed/bath upstairs Alternate Level Stairs-Number of Steps: 12 Alternate Level Stairs-Rails: Right Bathroom Shower/Tub: Walk-in shower;Curtain         Home Equipment: Bedside commode;Hand held shower head;Shower seat;Grab bars - toilet;Walker - 2 wheels          Prior Functioning/Environment Level of Independence: Needs assistance  Gait / Transfers Assistance Needed: daughter reports pt was generally steady with walking (has become more rigid lately), but had been safe ascending/descending stairs and walking indoors independently ADL's / Homemaking Assistance Needed: daugher assisted due to decr cogntion Communication / Swallowing Assistance Needed: non verbal      OT Diagnosis: Generalized weakness;Cognitive deficits         OT Goals(Current goals can be found in the care plan section) Acute Rehab OT Goals Patient Stated Goal: pt non-verbal  OT Frequency:  End of Session    Activity Tolerance: Patient tolerated treatment well Patient left: in bed;with family/visitor present   Time: 1259-1311 OT Time Calculation (min): 12 min Charges:  OT General Charges $OT Visit: 1 Procedure OT Evaluation $Initial OT Evaluation Tier I: 1 Procedure OT Treatments $Self Care/Home Management : 8-22 mins  Evette GeorgesLeonard, Draken Farrior Eva [phone] 03/17/2014, 2:40 PM

## 2014-03-17 NOTE — Progress Notes (Signed)
Overall stable. No new change in status or plans. Follow-up with Dr. Wynetta Emerycram as an outpatient.

## 2014-03-17 NOTE — Progress Notes (Signed)
Also spoke with daughter recently added to his regarding heart block. We have elected to pursue recorder implantation unless there is evidence of more heart block over the next 48 hours. In that case we will pursue pacing.

## 2014-03-18 DIAGNOSIS — S129XXD Fracture of neck, unspecified, subsequent encounter: Secondary | ICD-10-CM

## 2014-03-18 DIAGNOSIS — I441 Atrioventricular block, second degree: Secondary | ICD-10-CM

## 2014-03-18 DIAGNOSIS — S22000S Wedge compression fracture of unspecified thoracic vertebra, sequela: Secondary | ICD-10-CM

## 2014-03-18 NOTE — Progress Notes (Signed)
Overall unchanged. No new neurosurgical issues. Reconsult if problems arise.

## 2014-03-18 NOTE — Progress Notes (Signed)
        Patient Name: Brandi Schneider      SUBJECTIVE:*non verbal in bed asleep   Past Medical History  Diagnosis Date  . Dementia   . Hypothyroid   . Hypercholesteremia     Scheduled Meds:  Scheduled Meds: . cholecalciferol  1,000 Units Oral Daily  . estrogens (conjugated)  0.3 mg Oral Daily  . heparin  5,000 Units Subcutaneous 3 times per day  . levothyroxine  75 mcg Oral QAC breakfast  . memantine  10 mg Oral BID  . mirabegron ER  25 mg Oral Daily  . mirtazapine  15 mg Oral QHS  . polyethylene glycol  17 g Oral Daily  . senna-docusate  2 tablet Oral BID  . sodium chloride  3 mL Intravenous Q12H   Continuous Infusions:  acetaminophen **OR** acetaminophen, HYDROcodone-acetaminophen, mineral oil, RESOURCE THICKENUP CLEAR    PHYSICAL EXAM Filed Vitals:   03/17/14 0649 03/17/14 1744 03/17/14 2108 03/18/14 0148  BP: 145/81 125/70 112/68 121/48  Pulse: 83 94 98 97  Temp: 98.6 F (37 C) 99.3 F (37.4 C) 98 F (36.7 C) 98.9 F (37.2 C)  TempSrc: Axillary Axillary Oral Axillary  Resp: 20 18 20 18   Height:      Weight:      SpO2: 95% 94% 95% 94%   Well developed and nourished in no acute distress     TELEMETRY: Reviewed telemetry pt in *normal sin:    Intake/Output Summary (Last 24 hours) at 03/18/14 0918 Last data filed at 03/17/14 1730  Gross per 24 hour  Intake    360 ml  Output      0 ml  Net    360 ml    LABS: Basic Metabolic Panel:  Recent Labs Lab 03/15/14 0050 03/16/14 0421 03/16/14 1530  NA 140 139  --   K 4.3 4.0  --   CL 103 103  --   CO2 22 22  --   GLUCOSE 147* 116*  --   BUN 17 11  --   CREATININE 0.65 0.60  --   CALCIUM 9.6 9.3  --   MG  --   --  2.1   Cardiac Enzymes: No results for input(s): CKTOTAL, CKMB, CKMBINDEX, TROPONINI in the last 72 hours. CBC:  Recent Labs Lab 03/15/14 0050 03/16/14 0421  WBC 9.5 6.0  NEUTROABS 7.5  --   HGB 13.5 12.6  HCT 41.1 38.4  MCV 88.0 86.5  PLT 187 196     Recent  Labs  03/16/14 1534  TSH 2.240      ASSESSMENT AND PLAN:  Active Problems:   Fall   Cervical spine fracture   Thoracic compression fracture   Frontotemporal dementia   Hypothyroidism   Complete heart block   2Nd degree AV block - most consistent with Mobitz Type 2.   Drug action  Will follow overnight and consider loop in am  Signed, Sherryl MangesSteven Jahmya Onofrio MD  03/18/2014

## 2014-03-18 NOTE — Progress Notes (Signed)
PATIENT DETAILS Name: Brandi Schneider Age: 68 y.o. Sex: female Date of Birth: [birth year] Admit Date: 03/14/2014 Admitting Physician Eduard Clos, MD ZOX:WRUEAV, Jill Side, MD  Subjective: She remains nonverbal however was able to follow a few simple commands for me, squeeze my fingers and wiggled toes bilaterally. He appears comfortable no acute distress. Tolerating by mouth intake.  Assessment/Plan: Active Problems:  Mechanical fall:  -Patient having mechanical fall at home. During this hospitalization found to have 5.8 second pause -Patient evaluated by physical therapy who recommended home health PT    Base of dens fracture/Comminuted fracture of left transverse process of C3/C3 Left lamina fracture and C1 left posterior arch fracture/acute compression fracture of T3/T5 vertebral bodies: Secondary to a mechanical fall. Seen by neurosurgery, not a surgical candidate, recommendations are pain control, mobilize as tolerated, and remain in a cervical collar for 3-6 months. Outpatient follow-up with Dr. Wynetta Emery.  5.8 second sinus pause with brief period of complete heart block: Occurred on 11/27, seen by cardiology/EPS.  -plan for loop recorder implantation by cardiology on 03/19/2014    Nondisplaced left radial neck fracture: Seen by hand surgeon/orthopedics, okay to leave out of sling or splint. Follow-up with Dr. Janee Morn in 2 weeks for recheck.    Fecal impaction: Numerous BM's yesterday with  Fleet enema 1, continue on oral MiraLAX/Senokot.    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 Follow some commands and will squeeze my hands. Continue Namenda, have stopped Aricept given Sinus pause/Complete Heart Block.    Hypothyroidism: Continue levothyroxine.  Disposition: Remain inpatient- home health PT on discharge  Antibiotics:  Anti-infectives    None     DVT Prophylaxis: Prophylactic Heparin   Code Status: Full code  Family Communication None at  bedside-spoke with  daughter Merwyn Katos [phone] on 11/27  Procedures:  None  CONSULTS:  Cardiology  Orthopedics  Neurosurgery  Time spent 25 minutes  MEDICATIONS: Scheduled Meds: . cholecalciferol  1,000 Units Oral Daily  . estrogens (conjugated)  0.3 mg Oral Daily  . heparin  5,000 Units Subcutaneous 3 times per day  . levothyroxine  75 mcg Oral QAC breakfast  . memantine  10 mg Oral BID  . mirabegron ER  25 mg Oral Daily  . mirtazapine  15 mg Oral QHS  . polyethylene glycol  17 g Oral Daily  . senna-docusate  2 tablet Oral BID  . sodium chloride  3 mL Intravenous Q12H   Continuous Infusions:  PRN Meds:.acetaminophen **OR** acetaminophen, HYDROcodone-acetaminophen, mineral oil, RESOURCE THICKENUP CLEAR    PHYSICAL EXAM: Vital signs in last 24 hours: Filed Vitals:   03/17/14 0649 03/17/14 1744 03/17/14 2108 03/18/14 0148  BP: 145/81 125/70 112/68 121/48  Pulse: 83 94 98 97  Temp: 98.6 F (37 C) 99.3 F (37.4 C) 98 F (36.7 C) 98.9 F (37.2 C)  TempSrc: Axillary Axillary Oral Axillary  Resp: 20 18 20 18   Height:      Weight:      SpO2: 95% 94% 95% 94%    Weight change:  Filed Weights   03/15/14 0004  Weight: 72.576 kg (160 lb)   Body mass index is 25.84 kg/(m^2).   Gen Exam: Awake, non verbal at baseline. Follows some commands Neck: Supple, No JVD.  Cervical collar in place Chest: B/L Clear.   CVS: S1 S2 Regular, no murmurs.  Abdomen: soft, BS +, non tender, non distended.  Extremities: no edema, lower extremities warm to touch. Neurologic:  Gen weakness-but seems to move all 4 ext Skin: No Rash.   Wounds: N/A.    Intake/Output from previous day:  Intake/Output Summary (Last 24 hours) at 03/18/14 1031 Last data filed at 03/17/14 1730  Gross per 24 hour  Intake    120 ml  Output      0 ml  Net    120 ml     LAB RESULTS: CBC  Recent Labs Lab 03/15/14 0050 03/16/14 0421  WBC 9.5 6.0  HGB 13.5 12.6  HCT 41.1 38.4  PLT  187 196  MCV 88.0 86.5  MCH 28.9 28.4  MCHC 32.8 32.8  RDW 13.0 13.1  LYMPHSABS 1.2  --   MONOABS 0.6  --   EOSABS 0.1  --   BASOSABS 0.0  --     Chemistries   Recent Labs Lab 03/15/14 0050 03/16/14 0421 03/16/14 1530  NA 140 139  --   K 4.3 4.0  --   CL 103 103  --   CO2 22 22  --   GLUCOSE 147* 116*  --   BUN 17 11  --   CREATININE 0.65 0.60  --   CALCIUM 9.6 9.3  --   MG  --   --  2.1    CBG:  Recent Labs Lab 03/16/14 2118 03/17/14 0644  GLUCAP 104* 119*    GFR Estimated Creatinine Clearance: 69.6 mL/min (by C-G formula based on Cr of 0.6).  Coagulation profile No results for input(s): INR, PROTIME in the last 168 hours.  Cardiac Enzymes No results for input(s): CKMB, TROPONINI, MYOGLOBIN in the last 168 hours.  Invalid input(s): CK  Invalid input(s): POCBNP No results for input(s): DDIMER in the last 72 hours. No results for input(s): HGBA1C in the last 72 hours. No results for input(s): CHOL, HDL, LDLCALC, TRIG, CHOLHDL, LDLDIRECT in the last 72 hours.  Recent Labs  03/16/14 1534  TSH 2.240   No results for input(s): VITAMINB12, FOLATE, FERRITIN, TIBC, IRON, RETICCTPCT in the last 72 hours. No results for input(s): LIPASE, AMYLASE in the last 72 hours.  Urine Studies No results for input(s): UHGB, CRYS in the last 72 hours.  Invalid input(s): UACOL, UAPR, USPG, UPH, UTP, UGL, UKET, UBIL, UNIT, UROB, ULEU, UEPI, UWBC, URBC, UBAC, CAST, UCOM, BILUA  MICROBIOLOGY: Recent Results (from the past 240 hour(s))  Urine culture     Status: None   Collection Time: 03/15/14  6:38 PM  Result Value Ref Range Status   Specimen Description URINE, CATHETERIZED  Final   Special Requests NONE  Final   Culture  Setup Time   Final    03/16/2014 04:19 Performed at [facility]    Colony Count NO GROWTH Performed at [facility]   Final   Culture NO GROWTH Performed at [facility]   Final   Report Status 03/16/2014 FINAL   Final    RADIOLOGY STUDIES/RESULTS: Dg Chest 1 View  03/15/2014   CLINICAL DATA:  Fall.  Dementia  EXAM: CHEST - 1 VIEW  COMPARISON:  None  FINDINGS: Normal heart size. There is no pleural effusion identified. Low lung volumes and asymmetric elevation of the right hemidiaphragm noted. Scar versus platelike atelectasis is identified in the left base. There is coarsened interstitial markings noted bilaterally.  IMPRESSION: 1. Left base scar versus atelectasis. 2. Diffuse, chronic appearing interstitial coarsening.   Electronically Signed   By: Signa Kell M.D.   On: 03/15/2014 01:46   Dg Pelvis 1-2  Views  03/15/2014   CLINICAL DATA:  Initial evaluation for acute trauma.  Fall.  EXAM: PELVIS - 1-2 VIEW  COMPARISON:  None.  FINDINGS: No definite fracture identified. Linear lucency traversing the left pubis symphysis is favored to be chronic in nature or perhaps related to summation of overlying soft tissue shadows. No pelvic bone lesions are seen. SI joints are approximated. Lower lumbar spine grossly unremarkable.  IMPRESSION: 1. Age-indeterminate linear lucency traversing the left pubis symphysis. While this finding may be chronic in nature or perhaps related to summation of shadows, possible acute fracture is not excluded. Correlation with physical exam for possible fracture at this site recommended. 2. No other acute traumatic injury within the pelvis.   Electronically Signed   By: Rise Mu M.D.   On: 03/15/2014 01:51   Dg Elbow Complete Left  03/15/2014   CLINICAL DATA:  Fall.  Elbow laceration.  EXAM: LEFT ELBOW - COMPLETE 3+ VIEW  COMPARISON:  None.  FINDINGS: Cortical offset along the radial neck, only visualized in the frontal projection. Prominent anterior elbow fat pad suggesting small joint effusion. No malalignment.  IMPRESSION: Suspect a nondisplaced radial neck fracture.   Electronically Signed   By: Tiburcio Pea M.D.   On: 03/15/2014 01:49   Ct Head Wo  Contrast  03/15/2014   CLINICAL DATA:  Fall.  EXAM: CT HEAD WITHOUT CONTRAST  CT CERVICAL SPINE WITHOUT CONTRAST  TECHNIQUE: Multidetector CT imaging of the head and cervical spine was performed following the standard protocol without intravenous contrast. Multiplanar CT image reconstructions of the cervical spine were also generated.  COMPARISON:  09/04/09  FINDINGS: CT HEAD FINDINGS  Prominence of the sulci and ventricles consistent with brain atrophy. Chronic right basal ganglia lacunar infarct noted. No evidence for acute intracranial hemorrhage, infarct or mass. The paranasal sinuses are clear. The mastoid air cells are clear. The calvarium appears intact.  CT CERVICAL SPINE FINDINGS  Straightening of normal cervical lordosis. The vertebral body heights are well preserved. There is multi level disc space narrowing and ventral endplate spurring compatible with degenerative disc disease. The facet joints are aligned bilaterally. There is a nondisplaced fracture involving the posterior arch of C1 on the left, image 28/series 8. There is a nondisplaced base of dens fracture which extends into the left side of the vertebral body. Comminuted fracture deformity involves the left-sided transverse process of C2 with mild posterior displacement of fracture fragments into the canal of the left vertebral artery. The left posterior arch of C3 fracture is also noted lower cervical spine appears intact.  IMPRESSION: 1. No acute intracranial abnormalities. 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 2. Base of dens fracture which extends into the left side of the vertebral body. 3. Comminuted fracture involving the left transverse process of the C2 vertebra with possible involvement of the vertebral canal 4. C3 Left lamina fracture and C1 left posterior arch fracture, nondisplaced 5. Cervical degenerative disc disease. 6. Critical Value/emergent results were called by telephone at the time of interpretation on  03/15/2014 at 2:01 am to Dr. Warnell Forester , who verbally acknowledged these results.   Electronically Signed   By: Signa Kell M.D.   On: 03/15/2014 02:03   Ct Angio Neck W/cm &/or Wo/cm  03/15/2014   EXAM: CT ANGIOGRAPHY NECK  TECHNIQUE: Multidetector CT imaging of the neck was performed using the standard protocol during bolus administration of intravenous contrast. Multiplanar CT image reconstructions and MIPs were obtained to evaluate the vascular anatomy.  Carotid stenosis measurements (when applicable) are obtained utilizing NASCET criteria, using the distal internal carotid diameter as the denominator.  CONTRAST:  50mL OMNIPAQUE IOHEXOL 350 MG/ML SOLN  COMPARISON:  Prior CT of the cervical spine performed earlier on the same day.  FINDINGS: Scattered atheromatous plaque present within the visualize aortic arch which is of normal caliber. Aberrant right subclavian artery noted. No high-grade stenosis seen at the origin of the great vessels. Subclavian arteries well opacified bilaterally.  There is a common origin of the common carotid arteries from the aortic arch. Common carotid arteries are tortuous in medialized into the mid neck and retropharyngeal space. No hemodynamically significant stenosis or evidence of traumatic injury identified within the common carotid arteries bilaterally. Carotid bifurcations within normal limits.  Internal carotid arteries are well opacified to the skullbase without evidence of dissection, occlusion, or hemodynamically significant stenosis. Visualized portions of the circle of Willis are unremarkable.  External carotid arteries and their branches are within normal limits.  Both vertebral arteries arise from the subclavian arteries. The left vertebral artery is dominant. The right vertebral artery is well opacified along its entire course without evidence of acute traumatic injury, dissection, or stenosis.  Mild mass effect on the left V2/V3 segment as it courses by the  fracture fragments at the level of the left transverse foramen. The vertebral artery delete is narrowed by approximately 50%, which may in part be related to Vasa spasm due to the adjacent fracture. This is best seen on sagittal projection (series 404, image 150). No dissection flap or pseudoaneurysm identified. Distally, the left vertebral artery is well opacified to the level of the vertebrobasilar junction.  Previously identified cervical spine fractures again noted, better characterized on prior CT. No other soft tissue abnormality within the neck.  No acute soft tissue abnormality identified within the neck. Thyroid gland within normal limits. Emphysematous changes noted within the visualized lungs.  IMPRESSION: 1. Focal narrowing of approximately 50% of the left vertebral artery at the level of the left C2 vertebral body fractures. This finding may in part be related to vasospasm due to the adjacent fracture. No frank dissection flap identified. 2. No other acute traumatic injury to the major arterial vasculature of the neck. 3. Stable cervical spine fractures, better evaluated on prior CT of the cervical spine. Results were called by telephone at the time of interpretation on 03/15/2014 at 4:16 am to Dr. Warnell Forester , who verbally acknowledged these results.   Electronically Signed   By: Rise Mu M.D.   On: 03/15/2014 04:17   Ct Chest W Contrast  03/15/2014   CLINICAL DATA:  Initial evaluation for acute trauma. Fall down 10 steps.  EXAM: CT CHEST, ABDOMEN, AND PELVIS WITH CONTRAST  TECHNIQUE: Multidetector CT imaging of the chest, abdomen and pelvis was performed following the standard protocol during bolus administration of intravenous contrast.  CONTRAST:  50 cc of Omni 300.  COMPARISON:  None.  FINDINGS: CT CHEST FINDINGS  Scattered shotty bilateral hilar lymph nodes are noted. No pathologically enlarged mediastinal, hilar, or axillary lymph nodes are identified.  Intrathoracic aorta is of  normal caliber and appearance. Scattered calcified plaque present within the aortic arch. No evidence for acute traumatic aortic injury. No mediastinal hematoma.  Heart size is normal.  No pericardial effusion.  Evaluation of the lungs is somewhat limited by motion artifact. Upper lobe predominant centrilobular emphysema present. Atelectatic changes seen dependently within the bilateral lower lobes. No pneumothorax. No focal infiltrates to suggest pulmonary  contusion or infection. No pulmonary edema or pleural effusion.  Compression deformity at the superior endplate of T3 is suspicious for possible acute fracture (series 204, image 71). There is approximately 30% of height loss without significant retropulsion. There is an additional compression deformity of the T5 vertebral body with approximately 20-30% of height loss with a retropulsion, also suspicious for acute fracture. No other acute fracture or other osseous abnormality within the thorax.  CT ABDOMEN AND PELVIS FINDINGS  Scattered subcentimeter hypodensities within the spleen are noted, too small the characterize by CT, but may reflect small cyst. A 9 mm hyperdense lesion within the left hepatic lobe is noted, indeterminate, but likely a small benign hemangioma. Liver is otherwise unremarkable. Gallbladder within normal limits. No biliary dilatation. Spleen is intact. No perisplenic hematoma. Adrenal glands and pancreas are within normal limits.  Kidneys are equal in size with symmetric enhancement. No nephrolithiasis, hydronephrosis, or focal enhancing renal mass.  Stomach within normal limits. No evidence for bowel obstruction or acute bowel injury. No abnormal wall thickening, mucosal enhancement, or inflammatory fat stranding seen about the bowels. Prominent fecal impaction present within the rectal vault.  Bladder intact an within normal limits. Uterus and ovaries are not visualized.  No adenopathy. No free air or fluid. Mild mesenteric edema noted  centrally within the abdomen, of uncertain clinical significance.  Moderate atheromatous plaque present within the infrarenal aorta without associated aneurysm. No contrast extravasation. No mesenteric or retroperitoneal hematoma.  No acute fracture within the pelvis. No spinal fracture within the abdomen and pelvis. Degenerative changes noted about the L4-5 intervertebral disc space.  IMPRESSION: 1. Compression deformities of the T3 and T5 vertebral bodies with associated 20-30% of height loss as above, suspicious for acute compression fractures. No significant retropulsion. 2. No other acute traumatic injury within the chest, abdomen, and pelvis. 3. Prominent fecal impaction within the rectal vault without associated obstruction.   Electronically Signed   By: Rise MuBenjamin  McClintock M.D.   On: 03/15/2014 05:38   Ct Cervical Spine Wo Contrast  03/15/2014   CLINICAL DATA:  Fall.  EXAM: CT HEAD WITHOUT CONTRAST  CT CERVICAL SPINE WITHOUT CONTRAST  TECHNIQUE: Multidetector CT imaging of the head and cervical spine was performed following the standard protocol without intravenous contrast. Multiplanar CT image reconstructions of the cervical spine were also generated.  COMPARISON:  09/04/09  FINDINGS: CT HEAD FINDINGS  Prominence of the sulci and ventricles consistent with brain atrophy. Chronic right basal ganglia lacunar infarct noted. No evidence for acute intracranial hemorrhage, infarct or mass. The paranasal sinuses are clear. The mastoid air cells are clear. The calvarium appears intact.  CT CERVICAL SPINE FINDINGS  Straightening of normal cervical lordosis. The vertebral body heights are well preserved. There is multi level disc space narrowing and ventral endplate spurring compatible with degenerative disc disease. The facet joints are aligned bilaterally. There is a nondisplaced fracture involving the posterior arch of C1 on the left, image 28/series 8. There is a nondisplaced base of dens fracture which  extends into the left side of the vertebral body. Comminuted fracture deformity involves the left-sided transverse process of C2 with mild posterior displacement of fracture fragments into the canal of the left vertebral artery. The left posterior arch of C3 fracture is also noted lower cervical spine appears intact.  IMPRESSION: 1. No acute intracranial abnormalities. 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 2. Base of dens fracture which extends into the left side of the vertebral body. 3. Comminuted fracture  involving the left transverse process of the C2 vertebra with possible involvement of the vertebral canal 4. C3 Left lamina fracture and C1 left posterior arch fracture, nondisplaced 5. Cervical degenerative disc disease. 6. Critical Value/emergent results were called by telephone at the time of interpretation on 03/15/2014 at 2:01 am to Dr. Warnell Forester , who verbally acknowledged these results.   Electronically Signed   By: Signa Kell M.D.   On: 03/15/2014 02:03   Ct Abdomen Pelvis W Contrast  03/15/2014   CLINICAL DATA:  Initial evaluation for acute trauma. Fall down 10 steps.  EXAM: CT CHEST, ABDOMEN, AND PELVIS WITH CONTRAST  TECHNIQUE: Multidetector CT imaging of the chest, abdomen and pelvis was performed following the standard protocol during bolus administration of intravenous contrast.  CONTRAST:  50 cc of Omni 300.  COMPARISON:  None.  FINDINGS: CT CHEST FINDINGS  Scattered shotty bilateral hilar lymph nodes are noted. No pathologically enlarged mediastinal, hilar, or axillary lymph nodes are identified.  Intrathoracic aorta is of normal caliber and appearance. Scattered calcified plaque present within the aortic arch. No evidence for acute traumatic aortic injury. No mediastinal hematoma.  Heart size is normal.  No pericardial effusion.  Evaluation of the lungs is somewhat limited by motion artifact. Upper lobe predominant centrilobular emphysema present. Atelectatic  changes seen dependently within the bilateral lower lobes. No pneumothorax. No focal infiltrates to suggest pulmonary contusion or infection. No pulmonary edema or pleural effusion.  Compression deformity at the superior endplate of T3 is suspicious for possible acute fracture (series 204, image 71). There is approximately 30% of height loss without significant retropulsion. There is an additional compression deformity of the T5 vertebral body with approximately 20-30% of height loss with a retropulsion, also suspicious for acute fracture. No other acute fracture or other osseous abnormality within the thorax.  CT ABDOMEN AND PELVIS FINDINGS  Scattered subcentimeter hypodensities within the spleen are noted, too small the characterize by CT, but may reflect small cyst. A 9 mm hyperdense lesion within the left hepatic lobe is noted, indeterminate, but likely a small benign hemangioma. Liver is otherwise unremarkable. Gallbladder within normal limits. No biliary dilatation. Spleen is intact. No perisplenic hematoma. Adrenal glands and pancreas are within normal limits.  Kidneys are equal in size with symmetric enhancement. No nephrolithiasis, hydronephrosis, or focal enhancing renal mass.  Stomach within normal limits. No evidence for bowel obstruction or acute bowel injury. No abnormal wall thickening, mucosal enhancement, or inflammatory fat stranding seen about the bowels. Prominent fecal impaction present within the rectal vault.  Bladder intact an within normal limits. Uterus and ovaries are not visualized.  No adenopathy. No free air or fluid. Mild mesenteric edema noted centrally within the abdomen, of uncertain clinical significance.  Moderate atheromatous plaque present within the infrarenal aorta without associated aneurysm. No contrast extravasation. No mesenteric or retroperitoneal hematoma.  No acute fracture within the pelvis. No spinal fracture within the abdomen and pelvis. Degenerative changes noted  about the L4-5 intervertebral disc space.  IMPRESSION: 1. Compression deformities of the T3 and T5 vertebral bodies with associated 20-30% of height loss as above, suspicious for acute compression fractures. No significant retropulsion. 2. No other acute traumatic injury within the chest, abdomen, and pelvis. 3. Prominent fecal impaction within the rectal vault without associated obstruction.   Electronically Signed   By: Rise Mu M.D.   On: 03/15/2014 05:38    Jeralyn Bennett, MD  [facility] Pager:[phone]  If 7PM-7AM, please contact night-coverage [email or web address]  Password [email or web address] 03/18/2014, 10:31 AM   LOS: 4 days

## 2014-03-19 ENCOUNTER — Encounter (HOSPITAL_COMMUNITY)
Admission: EM | Disposition: A | Discharge status: Home Health | Payer: Self-pay | Source: Home / Self Care | Attending: Internal Medicine

## 2014-03-19 DIAGNOSIS — W19XXXD Unspecified fall, subsequent encounter: Secondary | ICD-10-CM

## 2014-03-19 DIAGNOSIS — R55 Syncope and collapse: Secondary | ICD-10-CM

## 2014-03-19 HISTORY — PX: LOOP RECORDER IMPLANT: SHX5477

## 2014-03-19 SURGERY — LOOP RECORDER IMPLANT
Anesthesia: LOCAL

## 2014-03-19 MED ORDER — STARCH (THICKENING) PO POWD
ORAL | Status: DC | PRN
  Filled 2014-03-19: qty 227

## 2014-03-19 MED ORDER — LIDOCAINE-EPINEPHRINE 1 %-1:100000 IJ SOLN
INTRAMUSCULAR | Status: AC
  Filled 2014-03-19: qty 1

## 2014-03-19 NOTE — CV Procedure (Signed)
Pre op Dx syncope with intermittent heart block ? drugrelated Post op Dx    Procedure  Loop Recorder implantation  After routine prep and drape of the left parasternal area, a small incision was created. A [employer] LINQ Reveal Loop Recorder  Serial Number  [id number] was inserted.    SteriStrip dressing was  applied.  The patient tolerated the procedure without apparent complication.

## 2014-03-19 NOTE — Progress Notes (Signed)
Speech Language Pathology Treatment: Dysphagia  Patient Details Name: Brandi Schneider MRN: [id number] DOB: 01-31-1946 Today's Date: 03/19/2014 Time: 4098-1191 SLP Time Calculation (min) (ACUTE ONLY): 29 min  Assessment / Plan / Recommendation Clinical Impression  Skilled treatment session focused on addressing dysphagia goals. Upon arrival, patient was partially reclined in bed with family at bedside. Student facilitated session by providing hand-over-hand assist and Mod-Max A multimodal cues for utilization of swallowing compensatory strategies of small bites and sips and slow rate during trials of Dys. 1, Dys. 2 and Dys 3 textures, as well as thin and nectar-thick liquids via straw. Patient demonstrated a suspected delayed swallow with puree textures due to palpated swallow at bedside however, did not demonstrate overt s/s of aspiration with any consistencies. Daughter reported baseline difficulties with thin liquids. As a result, recommend patient continue on current diet of Dys. 3 textures with nectar-thick liquids. Student also facilitated session by providing family education in regards to current diet restrictions, procedures for thickening to a nectar-thick consistency and f/u home health SLP services. Daughter verbalized understanding of education. As a result, patient ready to d/c home. Daughter agreeable to home health therapy with goals for regular textures and thin liquids given this is patient's baseline diet.    HPI HPI: 68 year old female with a history of hypothyroidism, fronto-temporal dementia, presented after a mechanical fall on the evening prior to admission. The patient is nonverbal at baseline, but is able to ambulate without any assistive devices at baseline. All this history is obtained from speaking with the patient's daughter at the bedside. Apparently, the patient was trying to walk up stairs when she had a mechanical fall and fell down 12-14 steps. There was no loss of  consciousness. When she was found on the hardwood floor, her head was stuck underneath a metal shelf. EMS was activated, and the patient was brought to emergency. To the daughter's knowledge, Pt has not had any fever, chills, chest pain, sob, n/v/d.  She has been eating well. At baseline, pt is incontinent of stool and urine. In the emergency department, the patient had numerous radiographic studies. CT of the chest revealed no acute intrathoracic trauma. There was left lower lobe atelectasis. CT of the abdomen and pelvis did not reveal any intra-abdominal trauma or hematoma. CT of the cervical spine revealed a comminuted C2 transverse process fracture as well as a left C3 lamina fracture and left C1 posterior arch fracture. Within the CT of the chest, the patient was also noted to have a T3 vertebral fracture as well as T5 compression fracture with 30% height loss. CT and her grandmother neck revealed focal narrowing of the left vertebral artery of 50% at the level of C2. EKG shows sinus rhythm with right bundle branch block. BMP and CBC were unremarkable. BSE complete on 11/27 with recommendations for Dys. 3 textures and nectar-thick liquids.   Pertinent Vitals Pain Assessment: No/denies pain  SLP Plan  All goals met;Discharge SLP treatment due to (comment) (diet advancement can be managed by home health therapy)    Recommendations Diet recommendations: Dysphagia 3 (mechanical soft);Nectar-thick liquid Liquids provided via: Cup;Straw Medication Administration: Crushed with puree (whole with puree if pt will tolerate) Supervision: Staff to assist with self feeding Compensations: Slow rate;Small sips/bites;Check for pocketing Postural Changes and/or Swallow Maneuvers: Seated upright 90 degrees;Upright 30-60 min after meal              Oral Care Recommendations: Oral care before and after PO Follow up Recommendations: Home health  SLP;24 hour supervision/assistance Plan: All goals met;Discharge SLP  treatment due to (comment) (diet advancement can be managed by home health therapy)    GO     Javonne Dorko 03/19/2014, 4:59 PM

## 2014-03-19 NOTE — Progress Notes (Signed)
PT Cancellation Note  Patient Details Name: Bari EdwardDiane L Lacks MRN: [id number] DOB: [birth year]   Cancelled Treatment:    Reason Eval/Treat Not Completed: No family currently present for family education. Noted possible plans for loop recorder vs pacer for today. Will continue to follow   Nejla Reasor 03/19/2014, 9:07 AM  Pager [phone]

## 2014-03-19 NOTE — Progress Notes (Addendum)
SUBJECTIVE: The patient is doing well today.  At this time, she denies chest pain, shortness of breath, or any new concerns.  CURRENT MEDICATIONS: . cholecalciferol  1,000 Units Oral Daily  . estrogens (conjugated)  0.3 mg Oral Daily  . heparin  5,000 Units Subcutaneous 3 times per day  . levothyroxine  75 mcg Oral QAC breakfast  . memantine  10 mg Oral BID  . mirabegron ER  25 mg Oral Daily  . mirtazapine  15 mg Oral QHS  . polyethylene glycol  17 g Oral Daily  . senna-docusate  2 tablet Oral BID  . sodium chloride  3 mL Intravenous Q12H    OBJECTIVE: Physical Exam: Filed Vitals:   03/18/14 1500 03/18/14 2200 03/19/14 0223 03/19/14 0559  BP: 116/64 125/69 118/55 103/60  Pulse: 87 87 87 84  Temp: 98.4 F (36.9 C) 96.8 F (36 C) 96.8 F (36 C)   TempSrc: Axillary Oral Oral Axillary  Resp: 17 18 18 16   Height:      Weight:      SpO2: 94% 96% 95% 95%    Telemetry reveals sinus rhythm  GEN- The patient is sleeping this morning, no family at bedside, no acute distress  Head- normocephalic, atraumatic Neck- with cervical collar Lungs- Clear to ausculation bilaterally, normal work of breathing Heart- Regular rate and rhythm, no murmurs, rubs or gallops, PMI not laterally displaced GI- soft, NT, ND, + BS Extremities- no clubbing, cyanosis, or edema Skin- no rash or lesion   LABS: Basic Metabolic Panel:  Recent Labs  16/01/9610/27/15 1530  MG 2.1   Thyroid Function Tests:  Recent Labs  03/16/14 1534  TSH 2.240    RADIOLOGY: Dg Chest 1 View 03/15/2014   CLINICAL DATA:  Fall.  Dementia  EXAM: CHEST - 1 VIEW  COMPARISON:  None  FINDINGS: Normal heart size. There is no pleural effusion identified. Low lung volumes and asymmetric elevation of the right hemidiaphragm noted. Scar versus platelike atelectasis is identified in the left base. There is coarsened interstitial markings noted bilaterally.  IMPRESSION: 1. Left base scar versus atelectasis. 2. Diffuse, chronic  appearing interstitial coarsening.   Electronically Signed   By: Signa Kellaylor  Stroud M.D.   On: 03/15/2014 01:46    Ct Head Wo Contrast 03/15/2014   CLINICAL DATA:  Fall.  EXAM: CT HEAD WITHOUT CONTRAST  CT CERVICAL SPINE WITHOUT CONTRAST  TECHNIQUE: Multidetector CT imaging of the head and cervical spine was performed following the standard protocol without intravenous contrast. Multiplanar CT image reconstructions of the cervical spine were also generated.  COMPARISON:  09/04/09  FINDINGS: CT HEAD FINDINGS  Prominence of the sulci and ventricles consistent with brain atrophy. Chronic right basal ganglia lacunar infarct noted. No evidence for acute intracranial hemorrhage, infarct or mass. The paranasal sinuses are clear. The mastoid air cells are clear. The calvarium appears intact.  CT CERVICAL SPINE FINDINGS  Straightening of normal cervical lordosis. The vertebral body heights are well preserved. There is multi level disc space narrowing and ventral endplate spurring compatible with degenerative disc disease. The facet joints are aligned bilaterally. There is a nondisplaced fracture involving the posterior arch of C1 on the left, image 28/series 8. There is a nondisplaced base of dens fracture which extends into the left side of the vertebral body. Comminuted fracture deformity involves the left-sided transverse process of C2 with mild posterior displacement of fracture fragments into the canal of the left vertebral artery. The left posterior arch of C3 fracture  is also noted lower cervical spine appears intact.  IMPRESSION: 1. No acute intracranial abnormalities. 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 2. Base of dens fracture which extends into the left side of the vertebral body. 3. Comminuted fracture involving the left transverse process of the C2 vertebra with possible involvement of the vertebral canal 4. C3 Left lamina fracture and C1 left posterior arch fracture, nondisplaced 5. Cervical  degenerative disc disease. 6. Critical Value/emergent results were called by telephone at the time of interpretation on 03/15/2014 at 2:01 am to Dr. Warnell ForesterREY WOFFORD , who verbally acknowledged these results.   Electronically Signed   By: Signa Kellaylor Stroud M.D.   On: 03/15/2014 02:03    ASSESSMENT AND PLAN:  Active Problems:   Fall   Cervical spine fracture   Thoracic compression fracture   Frontotemporal dementia   Hypothyroidism   Complete heart block   2Nd degree AV block - most consistent with Mobitz Type 2.   Drug action  1.  Intermittent complete heart block No further heart block off Aricept Per discussion with daughter, will proceed with implantable loop recorder today.  Wound check 04-02-14 at 10AM [facility]

## 2014-03-19 NOTE — Plan of Care (Signed)
Problem: SLP Dysphagia Goals Goal: Patient will utilize recommended strategies Patient will utilize recommended strategies during swallow to increase swallowing safety with  Outcome: Completed/Met Date Met:  03/19/14  Problem: SLP Dysphagia Goals Goal: Misc Dysphagia Goal Outcome: Completed/Met Date Met:  03/19/14

## 2014-03-19 NOTE — Discharge Summary (Addendum)
Physician Discharge Summary  Brandi Schneider MWN:[id number] DOB: [birth year] DOA: 03/14/2014  PCP: [name], MD  Admit date: 03/14/2014 Discharge date: 03/20/2014  Time spent: 35 minutes  Recommendations for Outpatient Follow-up:  1. A loop recorder was placed on 03/19/2014 by Dr. [name] of electrophysiology 2. Prior to discharge home health services were set up for home PT, RN, aide  Discharge Diagnoses:  Active Problems:   Fall   Cervical spine fracture   Thoracic compression fracture   Frontotemporal dementia   Hypothyroidism   Complete heart block   2Nd degree AV block - most consistent with Mobitz Type 2.   Drug action   Discharge Condition: Stable  Diet recommendation: Mechanical soft with nectar thick  Filed Weights   03/15/14 0004  Weight: 72.576 kg (160 lb)    History of present illness:  68 year old female with a history of hypothyroidism, fronto-temporal dementia, presented after a mechanical fall on the evening prior to admission. The patient is nonverbal at baseline, but is able to ambulate without any assistive devices at baseline. All this history is obtained from speaking with the patient's daughter at the bedside. Apparently, the patient was trying to walk up stairs when she had a mechanical fall and fell down 12-14 steps. There was no loss of consciousness. When she was found on the hardwood floor, her head was stuck underneath a metal shelf. EMS was activated, and the patient was brought to emergency. To the daughter's knowledge, Pt has not had any fever, chills, chest pain, sob, n/v/d. She has been eating well. At baseline, pt is incontinent of stool and urine.  In the emergency department, the patient the patient had numerous radiographic studies. CT of the chest revealed no acute intrathoracic trauma. There was left lower lobe atelectasis. CT of the abdomen and pelvis did not reveal any intra-abdominal trauma or hematoma. CT of the cervical spine  revealed a comminuted C2 transverse process fracture as well as a left C3 lamina fracture and left C1 posterior arch fracture. Within the CT of the chest, the patient was also noted to have a T3 vertebral fracture as well as T5 compression fracture with 30% height loss. CT and her grandmother neck revealed focal narrowing of the left vertebral artery of 50% at the level of C2. EKG shows sinus rhythm with right bundle branch block. BMP and CBC were unremarkable.  Hospital Course:  Patient is a pleasant 68 year old female with a past medical history of frontotemporal dementia, hypothyroidism, was admitted to medicine service on 03/15/2014. She had a mechanical fall at home, tripping on the stairs. There is no loss of consciousness. Imaging studies performed in the emergency room revealed comminuted C2 transverse process fracture, left C3 lamina fracture and left C1 posterior arch fracture. She was also noted to have a T3 vertebral fracture as well as T5 compression fracture. Patient was seen and evaluated by Dr. Yetta Barre of neurosurgery who recommended nonoperative treatment. Cervical collar was recommended for 3-6 months, as repeat imaging would be performed in the outpatient setting. During this hospitalization she was also seen by general surgery and orthopedic surgery. During this hospitalization she was monitored on telemetry and noted to have intermittent development of second-degree AV block as well as a prolonged episode of third-degree AV block that lasted approximate 5 seconds. Patient had been on Aricept therapy which was discontinued given this medications potential side effect of causing AV block send bradycardia. Dr. [name] of electrophysiology was consulted, options (pacemaker implant versus loop recorder) discussed  with family members. Loop recorder implant was elected as this procedure was performed on 03/19/2014. By 03/19/2014 patient had remained stable, was angling down the hallway, tolerating by  mouth intake. Home health services were set up for home PT, RN and aide. Delivery of hospital bed was also arranged. She was discharged to her home in stable condition with home health services on 03/19/2014.   Addendum: Patient's discharge was held per family's request as hospital bed had not been delivered yet. There are no events overnight, this morning she remains stable, nonverbal, tolerating by mouth intake. Plan to proceed with discharged today with home health services upon delivery of hospital bed.  Procedures:  Loop recorder implant performed on 03/19/2014 by Dr. Graciela HusbandsKlein of electrophysiology  Consultations:  Neurosurgery  Orthopedic surgery  General surgery  Cardiology  Discharge Exam: Filed Vitals:   03/19/14 1449  BP: 110/80  Pulse: 106  Temp: 97.3 F (36.3 C)  Resp: 18    Gen Exam: Awake, non verbal at baseline. Follows some commands Neck: Supple, No JVD. Cervical collar in place Chest: B/L Clear.  CVS: S1 S2 Regular, no murmurs.  Abdomen: soft, BS +, non tender, non distended.  Extremities: no edema, lower extremities warm to touch. Neurologic: Gen weakness-but seems to move all 4 ext Skin: No Rash.  Wounds: N/A.   Discharge Instructions You were cared for by a hospitalist during your hospital stay. If you have any questions about your discharge medications or the care you received while you were in the hospital after you are discharged, you can call the unit and asked to speak with the hospitalist on call if the hospitalist that took care of you is not available. Once you are discharged, your primary care physician will handle any further medical issues. Please note that NO REFILLS for any discharge medications will be authorized once you are discharged, as it is imperative that you return to your primary care physician (or establish a relationship with a primary care physician if you do not have one) for your aftercare needs so that they can reassess your  need for medications and monitor your lab values.  Discharge Instructions    Call MD for:  difficulty breathing, headache or visual disturbances    Complete by:  As directed      Call MD for:  extreme fatigue    Complete by:  As directed      Call MD for:  hives    Complete by:  As directed      Call MD for:  persistant dizziness or light-headedness    Complete by:  As directed      Call MD for:  persistant nausea and vomiting    Complete by:  As directed      Call MD for:  redness, tenderness, or signs of infection (pain, swelling, redness, odor or green/yellow discharge around incision site)    Complete by:  As directed      Call MD for:  severe uncontrolled pain    Complete by:  As directed      Call MD for:  temperature >100.4    Complete by:  As directed      Diet - low sodium heart healthy    Complete by:  As directed      Increase activity slowly    Complete by:  As directed           Current Discharge Medication List    CONTINUE these medications which have NOT  CHANGED   Details  cholecalciferol (VITAMIN D) 1000 UNITS tablet Take 1,000 Units by mouth daily.    levothyroxine (SYNTHROID, LEVOTHROID) 75 MCG tablet Take 75 mcg by mouth daily before breakfast.    memantine (NAMENDA) 10 MG tablet Take 10 mg by mouth 2 (two) times daily.    mirabegron ER (MYRBETRIQ) 25 MG TB24 Take 25 mg by mouth daily.    mirtazapine (REMERON) 15 MG tablet Take 15 mg by mouth at bedtime.      STOP taking these medications     Coconut Oil OIL      magnesium 30 MG tablet      OVER THE COUNTER MEDICATION      donepezil (ARICEPT) 10 MG tablet      estrogens, conjugated, (PREMARIN) 0.3 MG tablet        Allergies  Allergen Reactions  . Aricept [Donepezil Hcl] Diarrhea   Follow-up Information    Follow up with THOMPSON, DAVID A., MD.   Specialty:  Orthopedic Surgery   Why:  approximately 2 weeks from injury   Contact information:   [address] [place]  [address] [phone]       Follow up with CRAM,GARY P, MD. Schedule an appointment as soon as possible for a visit in 3 weeks.   Specialty:  Neurosurgery   Contact information:   [address] [place] [address] [phone]       Follow up with [facility].   Why:  Home Health Physical Therapy, RN, Occupational Therapy, Social Worker and Technical brewer information:   [address] [place] [address] [phone]       Follow up with [name], MD In 2 weeks.   Specialty:  Family Medicine   Contact information:   [address] [place] [address] [phone]       Follow up with Sherryl Manges, MD In 2 weeks.   Specialty:  Cardiology   Contact information:   [address] [place] [address] [phone]        The results of significant diagnostics from this hospitalization (including imaging, microbiology, ancillary and laboratory) are listed below for reference.    Significant Diagnostic Studies: Dg Chest 1 View  03/15/2014   CLINICAL DATA:  Fall.  Dementia  EXAM: CHEST - 1 VIEW  COMPARISON:  None  FINDINGS: Normal heart size. There is no pleural effusion identified. Low lung volumes and asymmetric elevation of the right hemidiaphragm noted. Scar versus platelike atelectasis is identified in the left base. There is coarsened interstitial markings noted bilaterally.  IMPRESSION: 1. Left base scar versus atelectasis. 2. Diffuse, chronic appearing interstitial coarsening.   Electronically Signed   By: Signa Kell M.D.   On: 03/15/2014 01:46   Dg Pelvis 1-2 Views  03/15/2014   CLINICAL DATA:  Initial evaluation for acute trauma.  Fall.  EXAM: PELVIS - 1-2 VIEW  COMPARISON:  None.  FINDINGS: No definite fracture identified. Linear lucency traversing the left pubis symphysis is favored to be chronic in nature or perhaps related to summation of overlying soft tissue shadows. No pelvic  bone lesions are seen. SI joints are approximated. Lower lumbar spine grossly unremarkable.  IMPRESSION: 1. Age-indeterminate linear lucency traversing the left pubis symphysis. While this finding may be chronic in nature or perhaps related to summation of shadows, possible acute fracture is not excluded. Correlation with physical exam for possible fracture at this site  recommended. 2. No other acute traumatic injury within the pelvis.   Electronically Signed   By: Rise MuBenjamin  McClintock M.D.   On: 03/15/2014 01:51   Dg Elbow Complete Left  03/15/2014   CLINICAL DATA:  Fall.  Elbow laceration.  EXAM: LEFT ELBOW - COMPLETE 3+ VIEW  COMPARISON:  None.  FINDINGS: Cortical offset along the radial neck, only visualized in the frontal projection. Prominent anterior elbow fat pad suggesting small joint effusion. No malalignment.  IMPRESSION: Suspect a nondisplaced radial neck fracture.   Electronically Signed   By: Tiburcio PeaJonathan  Watts M.D.   On: 03/15/2014 01:49   Ct Head Wo Contrast  03/15/2014   CLINICAL DATA:  Fall.  EXAM: CT HEAD WITHOUT CONTRAST  CT CERVICAL SPINE WITHOUT CONTRAST  TECHNIQUE: Multidetector CT imaging of the head and cervical spine was performed following the standard protocol without intravenous contrast. Multiplanar CT image reconstructions of the cervical spine were also generated.  COMPARISON:  09/04/09  FINDINGS: CT HEAD FINDINGS  Prominence of the sulci and ventricles consistent with brain atrophy. Chronic right basal ganglia lacunar infarct noted. No evidence for acute intracranial hemorrhage, infarct or mass. The paranasal sinuses are clear. The mastoid air cells are clear. The calvarium appears intact.  CT CERVICAL SPINE FINDINGS  Straightening of normal cervical lordosis. The vertebral body heights are well preserved. There is multi level disc space narrowing and ventral endplate spurring compatible with degenerative disc disease. The facet joints are aligned bilaterally. There is a  nondisplaced fracture involving the posterior arch of C1 on the left, image 28/series 8. There is a nondisplaced base of dens fracture which extends into the left side of the vertebral body. Comminuted fracture deformity involves the left-sided transverse process of C2 with mild posterior displacement of fracture fragments into the canal of the left vertebral artery. The left posterior arch of C3 fracture is also noted lower cervical spine appears intact.  IMPRESSION: 1. No acute intracranial abnormalities. 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 2. Base of dens fracture which extends into the left side of the vertebral body. 3. Comminuted fracture involving the left transverse process of the C2 vertebra with possible involvement of the vertebral canal 4. C3 Left lamina fracture and C1 left posterior arch fracture, nondisplaced 5. Cervical degenerative disc disease. 6. Critical Value/emergent results were called by telephone at the time of interpretation on 03/15/2014 at 2:01 am to Dr. Warnell ForesterREY WOFFORD , who verbally acknowledged these results.   Electronically Signed   By: Signa Kellaylor  Stroud M.D.   On: 03/15/2014 02:03   Ct Angio Neck W/cm &/or Wo/cm  03/15/2014   EXAM: CT ANGIOGRAPHY NECK  TECHNIQUE: Multidetector CT imaging of the neck was performed using the standard protocol during bolus administration of intravenous contrast. Multiplanar CT image reconstructions and MIPs were obtained to evaluate the vascular anatomy. Carotid stenosis measurements (when applicable) are obtained utilizing NASCET criteria, using the distal internal carotid diameter as the denominator.  CONTRAST:  50mL OMNIPAQUE IOHEXOL 350 MG/ML SOLN  COMPARISON:  Prior CT of the cervical spine performed earlier on the same day.  FINDINGS: Scattered atheromatous plaque present within the visualize aortic arch which is of normal caliber. Aberrant right subclavian artery noted. No high-grade stenosis seen at the origin of the great  vessels. Subclavian arteries well opacified bilaterally.  There is a common origin of the common carotid arteries from the aortic arch. Common carotid arteries are tortuous in medialized into the mid neck and retropharyngeal space. No hemodynamically significant  stenosis or evidence of traumatic injury identified within the common carotid arteries bilaterally. Carotid bifurcations within normal limits.  Internal carotid arteries are well opacified to the skullbase without evidence of dissection, occlusion, or hemodynamically significant stenosis. Visualized portions of the circle of Willis are unremarkable.  External carotid arteries and their branches are within normal limits.  Both vertebral arteries arise from the subclavian arteries. The left vertebral artery is dominant. The right vertebral artery is well opacified along its entire course without evidence of acute traumatic injury, dissection, or stenosis.  Mild mass effect on the left V2/V3 segment as it courses by the fracture fragments at the level of the left transverse foramen. The vertebral artery delete is narrowed by approximately 50%, which may in part be related to Vasa spasm due to the adjacent fracture. This is best seen on sagittal projection (series 404, image 150). No dissection flap or pseudoaneurysm identified. Distally, the left vertebral artery is well opacified to the level of the vertebrobasilar junction.  Previously identified cervical spine fractures again noted, better characterized on prior CT. No other soft tissue abnormality within the neck.  No acute soft tissue abnormality identified within the neck. Thyroid gland within normal limits. Emphysematous changes noted within the visualized lungs.  IMPRESSION: 1. Focal narrowing of approximately 50% of the left vertebral artery at the level of the left C2 vertebral body fractures. This finding may in part be related to vasospasm due to the adjacent fracture. No frank dissection flap  identified. 2. No other acute traumatic injury to the major arterial vasculature of the neck. 3. Stable cervical spine fractures, better evaluated on prior CT of the cervical spine. Results were called by telephone at the time of interpretation on 03/15/2014 at 4:16 am to Dr. Warnell Forester , who verbally acknowledged these results.   Electronically Signed   By: Rise Mu M.D.   On: 03/15/2014 04:17   Ct Chest W Contrast  03/15/2014   CLINICAL DATA:  Initial evaluation for acute trauma. Fall down 10 steps.  EXAM: CT CHEST, ABDOMEN, AND PELVIS WITH CONTRAST  TECHNIQUE: Multidetector CT imaging of the chest, abdomen and pelvis was performed following the standard protocol during bolus administration of intravenous contrast.  CONTRAST:  50 cc of Omni 300.  COMPARISON:  None.  FINDINGS: CT CHEST FINDINGS  Scattered shotty bilateral hilar lymph nodes are noted. No pathologically enlarged mediastinal, hilar, or axillary lymph nodes are identified.  Intrathoracic aorta is of normal caliber and appearance. Scattered calcified plaque present within the aortic arch. No evidence for acute traumatic aortic injury. No mediastinal hematoma.  Heart size is normal.  No pericardial effusion.  Evaluation of the lungs is somewhat limited by motion artifact. Upper lobe predominant centrilobular emphysema present. Atelectatic changes seen dependently within the bilateral lower lobes. No pneumothorax. No focal infiltrates to suggest pulmonary contusion or infection. No pulmonary edema or pleural effusion.  Compression deformity at the superior endplate of T3 is suspicious for possible acute fracture (series 204, image 71). There is approximately 30% of height loss without significant retropulsion. There is an additional compression deformity of the T5 vertebral body with approximately 20-30% of height loss with a retropulsion, also suspicious for acute fracture. No other acute fracture or other osseous abnormality within the  thorax.  CT ABDOMEN AND PELVIS FINDINGS  Scattered subcentimeter hypodensities within the spleen are noted, too small the characterize by CT, but may reflect small cyst. A 9 mm hyperdense lesion within the left hepatic lobe is noted,  indeterminate, but likely a small benign hemangioma. Liver is otherwise unremarkable. Gallbladder within normal limits. No biliary dilatation. Spleen is intact. No perisplenic hematoma. Adrenal glands and pancreas are within normal limits.  Kidneys are equal in size with symmetric enhancement. No nephrolithiasis, hydronephrosis, or focal enhancing renal mass.  Stomach within normal limits. No evidence for bowel obstruction or acute bowel injury. No abnormal wall thickening, mucosal enhancement, or inflammatory fat stranding seen about the bowels. Prominent fecal impaction present within the rectal vault.  Bladder intact an within normal limits. Uterus and ovaries are not visualized.  No adenopathy. No free air or fluid. Mild mesenteric edema noted centrally within the abdomen, of uncertain clinical significance.  Moderate atheromatous plaque present within the infrarenal aorta without associated aneurysm. No contrast extravasation. No mesenteric or retroperitoneal hematoma.  No acute fracture within the pelvis. No spinal fracture within the abdomen and pelvis. Degenerative changes noted about the L4-5 intervertebral disc space.  IMPRESSION: 1. Compression deformities of the T3 and T5 vertebral bodies with associated 20-30% of height loss as above, suspicious for acute compression fractures. No significant retropulsion. 2. No other acute traumatic injury within the chest, abdomen, and pelvis. 3. Prominent fecal impaction within the rectal vault without associated obstruction.   Electronically Signed   By: Rise Mu M.D.   On: 03/15/2014 05:38   Ct Cervical Spine Wo Contrast  03/15/2014   CLINICAL DATA:  Fall.  EXAM: CT HEAD WITHOUT CONTRAST  CT CERVICAL SPINE WITHOUT  CONTRAST  TECHNIQUE: Multidetector CT imaging of the head and cervical spine was performed following the standard protocol without intravenous contrast. Multiplanar CT image reconstructions of the cervical spine were also generated.  COMPARISON:  09/04/09  FINDINGS: CT HEAD FINDINGS  Prominence of the sulci and ventricles consistent with brain atrophy. Chronic right basal ganglia lacunar infarct noted. No evidence for acute intracranial hemorrhage, infarct or mass. The paranasal sinuses are clear. The mastoid air cells are clear. The calvarium appears intact.  CT CERVICAL SPINE FINDINGS  Straightening of normal cervical lordosis. The vertebral body heights are well preserved. There is multi level disc space narrowing and ventral endplate spurring compatible with degenerative disc disease. The facet joints are aligned bilaterally. There is a nondisplaced fracture involving the posterior arch of C1 on the left, image 28/series 8. There is a nondisplaced base of dens fracture which extends into the left side of the vertebral body. Comminuted fracture deformity involves the left-sided transverse process of C2 with mild posterior displacement of fracture fragments into the canal of the left vertebral artery. The left posterior arch of C3 fracture is also noted lower cervical spine appears intact.  IMPRESSION: 1. No acute intracranial abnormalities. 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 2. Base of dens fracture which extends into the left side of the vertebral body. 3. Comminuted fracture involving the left transverse process of the C2 vertebra with possible involvement of the vertebral canal 4. C3 Left lamina fracture and C1 left posterior arch fracture, nondisplaced 5. Cervical degenerative disc disease. 6. Critical Value/emergent results were called by telephone at the time of interpretation on 03/15/2014 at 2:01 am to Dr. Warnell Forester , who verbally acknowledged these results.   Electronically  Signed   By: Signa Kell M.D.   On: 03/15/2014 02:03   Ct Abdomen Pelvis W Contrast  03/15/2014   CLINICAL DATA:  Initial evaluation for acute trauma. Fall down 10 steps.  EXAM: CT CHEST, ABDOMEN, AND PELVIS WITH CONTRAST  TECHNIQUE: Multidetector  CT imaging of the chest, abdomen and pelvis was performed following the standard protocol during bolus administration of intravenous contrast.  CONTRAST:  50 cc of Omni 300.  COMPARISON:  None.  FINDINGS: CT CHEST FINDINGS  Scattered shotty bilateral hilar lymph nodes are noted. No pathologically enlarged mediastinal, hilar, or axillary lymph nodes are identified.  Intrathoracic aorta is of normal caliber and appearance. Scattered calcified plaque present within the aortic arch. No evidence for acute traumatic aortic injury. No mediastinal hematoma.  Heart size is normal.  No pericardial effusion.  Evaluation of the lungs is somewhat limited by motion artifact. Upper lobe predominant centrilobular emphysema present. Atelectatic changes seen dependently within the bilateral lower lobes. No pneumothorax. No focal infiltrates to suggest pulmonary contusion or infection. No pulmonary edema or pleural effusion.  Compression deformity at the superior endplate of T3 is suspicious for possible acute fracture (series 204, image 71). There is approximately 30% of height loss without significant retropulsion. There is an additional compression deformity of the T5 vertebral body with approximately 20-30% of height loss with a retropulsion, also suspicious for acute fracture. No other acute fracture or other osseous abnormality within the thorax.  CT ABDOMEN AND PELVIS FINDINGS  Scattered subcentimeter hypodensities within the spleen are noted, too small the characterize by CT, but may reflect small cyst. A 9 mm hyperdense lesion within the left hepatic lobe is noted, indeterminate, but likely a small benign hemangioma. Liver is otherwise unremarkable. Gallbladder within normal  limits. No biliary dilatation. Spleen is intact. No perisplenic hematoma. Adrenal glands and pancreas are within normal limits.  Kidneys are equal in size with symmetric enhancement. No nephrolithiasis, hydronephrosis, or focal enhancing renal mass.  Stomach within normal limits. No evidence for bowel obstruction or acute bowel injury. No abnormal wall thickening, mucosal enhancement, or inflammatory fat stranding seen about the bowels. Prominent fecal impaction present within the rectal vault.  Bladder intact an within normal limits. Uterus and ovaries are not visualized.  No adenopathy. No free air or fluid. Mild mesenteric edema noted centrally within the abdomen, of uncertain clinical significance.  Moderate atheromatous plaque present within the infrarenal aorta without associated aneurysm. No contrast extravasation. No mesenteric or retroperitoneal hematoma.  No acute fracture within the pelvis. No spinal fracture within the abdomen and pelvis. Degenerative changes noted about the L4-5 intervertebral disc space.  IMPRESSION: 1. Compression deformities of the T3 and T5 vertebral bodies with associated 20-30% of height loss as above, suspicious for acute compression fractures. No significant retropulsion. 2. No other acute traumatic injury within the chest, abdomen, and pelvis. 3. Prominent fecal impaction within the rectal vault without associated obstruction.   Electronically Signed   By: Rise Mu M.D.   On: 03/15/2014 05:38    Microbiology: Recent Results (from the past 240 hour(s))  Urine culture     Status: None   Collection Time: 03/15/14  6:38 PM  Result Value Ref Range Status   Specimen Description URINE, CATHETERIZED  Final   Special Requests NONE  Final   Culture  Setup Time   Final    03/16/2014 04:19 Performed at [facility]    Colony Count NO GROWTH Performed at [facility]   Final   Culture NO GROWTH Performed at [facility]   Final    Report Status 03/16/2014 FINAL  Final     Labs: Basic Metabolic Panel:  Recent Labs Lab 03/15/14 0050 03/16/14 0421 03/16/14 1530  NA 140 139  --  K 4.3 4.0  --   CL 103 103  --   CO2 22 22  --   GLUCOSE 147* 116*  --   BUN 17 11  --   CREATININE 0.65 0.60  --   CALCIUM 9.6 9.3  --   MG  --   --  2.1   Liver Function Tests: No results for input(s): AST, ALT, ALKPHOS, BILITOT, PROT, ALBUMIN in the last 168 hours. No results for input(s): LIPASE, AMYLASE in the last 168 hours. No results for input(s): AMMONIA in the last 168 hours. CBC:  Recent Labs Lab 03/15/14 0050 03/16/14 0421  WBC 9.5 6.0  NEUTROABS 7.5  --   HGB 13.5 12.6  HCT 41.1 38.4  MCV 88.0 86.5  PLT 187 196   Cardiac Enzymes: No results for input(s): CKTOTAL, CKMB, CKMBINDEX, TROPONINI in the last 168 hours. BNP: BNP (last 3 results) No results for input(s): PROBNP in the last 8760 hours. CBG:  Recent Labs Lab 03/16/14 2118 03/17/14 0644  GLUCAP 104* 119*       Signed:  Jeralyn Bennett  [facility] 03/19/2014, 3:29 PM

## 2014-03-19 NOTE — Progress Notes (Signed)
CARE MANAGEMENT NOTE 03/19/2014  Patient:  Brandi Schneider,Brandi Schneider   Account Number:  [id number]  Date Initiated:  03/16/2014  Documentation initiated by:  Jiles CrockerHANDLER,BRENDA  Subjective/Objective Assessment:   ADMITTED WITH NECK AND BACK FRACTURE     Action/Plan:   PLAN TO DISCHARGE HOME WITH DAUGHTER AND [facility]   Anticipated DC Date:  03/17/2014   Anticipated DC Plan:  HOME W HOME HEALTH SERVICES      DC Planning Services  CM consult      Choice offered to / List presented to:  C-4 Adult Children   [facility]  [facility]      DME agency  [facility]     [facility]  [facility]1 RN  HH-2 PT  HH-3 OT  HH-4 NURSE'S AIDE  HH-6 SOCIAL WORKER      HH agency  [facility]   Status of service:  Completed, signed off Medicare Important Message given?  YES (If response is "NO", the following Medicare IM given date fields will be blank) Date Medicare IM given:  03/19/2014 Medicare IM given by:  [facility]Chaise Mahabir Date Additional Medicare IM given:   Additional Medicare IM given by:    Discharge Disposition:  HOME W HOME HEALTH SERVICES  Per UR Regulation:  Reviewed for med. necessity/level of care/duration of stay  If discussed at Long Length of Stay Meetings, dates discussed:    Comments:  03/19/2014 1500 Notified [facility] of order for hospital bed for scheduled dc home today. Notified [facility] rep for HH. Isidoro DonningAlesia Mansour Balboa RN CCM Case Mgmt phone [phone]  03/16/2014- Talked to patient's daughter Brandi Schneider [phone]( [phone]) about HHC choices, [facility]; Marie with [facility] called for arrangements; Alexis GoodellB Chandler RN,BSN,MHA [phone]

## 2014-03-19 NOTE — Progress Notes (Signed)
Discharge order has been written, but family is waiting for home health to deliver a hospital bed to their house for pt. MD is aware and comfortable with pt staying here one more night for the bed to be delivered.

## 2014-03-20 MED ORDER — STARCH (THICKENING) PO POWD: 1.0000 | ORAL | Status: AC | PRN

## 2014-03-20 NOTE — Plan of Care (Signed)
Problem: Phase I Progression Outcomes Goal: Pain controlled with appropriate interventions Outcome: Completed/Met Date Met:  03/20/14 Goal: Initial discharge plan identified Outcome: Completed/Met Date Met:  03/20/14 Goal: Other Phase I Outcomes/Goals Outcome: Not Applicable Date Met:  93/57/01  Problem: Phase II Progression Outcomes Goal: Discharge plan established Outcome: Completed/Met Date Met:  03/20/14 Goal: Other Phase II Outcomes/Goals Outcome: Not Applicable Date Met:  77/93/90  Problem: Phase III Progression Outcomes Goal: Pain controlled on oral analgesia Outcome: Completed/Met Date Met:  03/20/14 Goal: Activity at appropriate level-compared to baseline (UP IN CHAIR FOR HEMODIALYSIS)  Outcome: Completed/Met Date Met:  03/20/14

## 2014-03-20 NOTE — Progress Notes (Signed)
Patient discharged home with family. Discharge instructions given and reviewed with the family. No questions at this time.

## 2014-03-20 NOTE — Plan of Care (Signed)
Problem: Phase III Progression Outcomes Goal: IV/normal saline lock discontinued Outcome: Completed/Met Date Met:  03/20/14     

## 2014-03-20 NOTE — Progress Notes (Addendum)
PATIENT DETAILS Name: Brandi EdwardDiane L Teutsch Age: 68 y.o. Sex: female Date of Birth: 1945-09-09 Admit Date: 03/14/2014 Admitting Physician Eduard ClosArshad N Kakrakandy, MD ZOX:[id number]:SNIDER, Jill SideALISON, MD  Subjective: Discharge orders written yesterday however, discharge held as hospital bed could not be delivered. She was kept overnight. No acute events, on this mornings evaluation she appears unchanged, neurologically stable. Remains nonverbal. Assessment/Plan: Active Problems:  Mechanical fall:  -Patient having mechanical fall at home. During this hospitalization found to have 5.8 second pause -Patient evaluated by physical therapy who recommended home health.   Disposition: Plan to discharge her today to her home upon delivery of hospital bed.  Antibiotics:  Anti-infectives    None     DVT Prophylaxis: Prophylactic Heparin   Code Status: Full code  Family Communication   Procedures:  None  CONSULTS:  Cardiology  Orthopedics  Neurosurgery  Time spent 15 min  MEDICATIONS: Scheduled Meds: . cholecalciferol  1,000 Units Oral Daily  . estrogens (conjugated)  0.3 mg Oral Daily  . heparin  5,000 Units Subcutaneous 3 times per day  . levothyroxine  75 mcg Oral QAC breakfast  . memantine  10 mg Oral BID  . mirabegron ER  25 mg Oral Daily  . mirtazapine  15 mg Oral QHS  . polyethylene glycol  17 g Oral Daily  . senna-docusate  2 tablet Oral BID  . sodium chloride  3 mL Intravenous Q12H   Continuous Infusions:  PRN Meds:.acetaminophen **OR** acetaminophen, food thickener, HYDROcodone-acetaminophen, mineral oil, RESOURCE THICKENUP CLEAR    PHYSICAL EXAM: Vital signs in last 24 hours: Filed Vitals:   03/19/14 1830 03/20/14 0225 03/20/14 0557 03/20/14 1054  BP: 133/90 116/85 125/87 112/73  Pulse: 105 103 104 105  Temp: 98.5 F (36.9 C) 98.5 F (36.9 C) 98.1 F (36.7 C) 98.5 F (36.9 C)  TempSrc: Oral Axillary Axillary Oral  Resp: 18 18 18 18   Height:      Weight:       SpO2: 93% 97% 96% 94%    Weight change:  Filed Weights   03/15/14 0004  Weight: 72.576 kg (160 lb)   Body mass index is 25.84 kg/(m^2).   Gen Exam: Awake, non verbal at baseline. Follows some commands Neck: Supple, No JVD.  Cervical collar in place Chest: B/L Clear.   CVS: S1 S2 Regular, no murmurs.  Abdomen: soft, BS +, non tender, non distended.  Extremities: no edema, lower extremities warm to touch. Neurologic: Gen weakness-but seems to move all 4 ext Skin: No Rash.   Wounds: N/A.    Intake/Output from previous day:  Intake/Output Summary (Last 24 hours) at 03/20/14 1056 Last data filed at 03/19/14 1108  Gross per 24 hour  Intake    120 ml  Output      0 ml  Net    120 ml     LAB RESULTS: CBC  Recent Labs Lab 03/15/14 0050 03/16/14 0421  WBC 9.5 6.0  HGB 13.5 12.6  HCT 41.1 38.4  PLT 187 196  MCV 88.0 86.5  MCH 28.9 28.4  MCHC 32.8 32.8  RDW 13.0 13.1  LYMPHSABS 1.2  --   MONOABS 0.6  --   EOSABS 0.1  --   BASOSABS 0.0  --     Chemistries   Recent Labs Lab 03/15/14 0050 03/16/14 0421 03/16/14 1530  NA 140 139  --   K 4.3 4.0  --   CL 103 103  --  CO2 22 22  --   GLUCOSE 147* 116*  --   BUN 17 11  --   CREATININE 0.65 0.60  --   CALCIUM 9.6 9.3  --   MG  --   --  2.1    CBG:  Recent Labs Lab 03/16/14 2118 03/17/14 0644  GLUCAP 104* 119*    GFR Estimated Creatinine Clearance: 69.6 mL/min (by C-G formula based on Cr of 0.6).  Coagulation profile No results for input(s): INR, PROTIME in the last 168 hours.  Cardiac Enzymes No results for input(s): CKMB, TROPONINI, MYOGLOBIN in the last 168 hours.  Invalid input(s): CK  Invalid input(s): POCBNP No results for input(s): DDIMER in the last 72 hours. No results for input(s): HGBA1C in the last 72 hours. No results for input(s): CHOL, HDL, LDLCALC, TRIG, CHOLHDL, LDLDIRECT in the last 72 hours. No results for input(s): TSH, T4TOTAL, T3FREE, THYROIDAB in the last 72  hours.  Invalid input(s): FREET3 No results for input(s): VITAMINB12, FOLATE, FERRITIN, TIBC, IRON, RETICCTPCT in the last 72 hours. No results for input(s): LIPASE, AMYLASE in the last 72 hours.  Urine Studies No results for input(s): UHGB, CRYS in the last 72 hours.  Invalid input(s): UACOL, UAPR, USPG, UPH, UTP, UGL, UKET, UBIL, UNIT, UROB, ULEU, UEPI, UWBC, URBC, UBAC, CAST, UCOM, BILUA  MICROBIOLOGY: Recent Results (from the past 240 hour(s))  Urine culture     Status: None   Collection Time: 03/15/14  6:38 PM  Result Value Ref Range Status   Specimen Description URINE, CATHETERIZED  Final   Special Requests NONE  Final   Culture  Setup Time   Final    03/16/2014 04:19 Performed at [facility]    Colony Count NO GROWTH Performed at [facility]   Final   Culture NO GROWTH Performed at [facility]   Final   Report Status 03/16/2014 FINAL  Final    RADIOLOGY STUDIES/RESULTS: Dg Chest 1 View  03/15/2014   CLINICAL DATA:  Fall.  Dementia  EXAM: CHEST - 1 VIEW  COMPARISON:  None  FINDINGS: Normal heart size. There is no pleural effusion identified. Low lung volumes and asymmetric elevation of the right hemidiaphragm noted. Scar versus platelike atelectasis is identified in the left base. There is coarsened interstitial markings noted bilaterally.  IMPRESSION: 1. Left base scar versus atelectasis. 2. Diffuse, chronic appearing interstitial coarsening.   Electronically Signed   By: Signa Kellaylor  Stroud M.D.   On: 03/15/2014 01:46   Dg Pelvis 1-2 Views  03/15/2014   CLINICAL DATA:  Initial evaluation for acute trauma.  Fall.  EXAM: PELVIS - 1-2 VIEW  COMPARISON:  None.  FINDINGS: No definite fracture identified. Linear lucency traversing the left pubis symphysis is favored to be chronic in nature or perhaps related to summation of overlying soft tissue shadows. No pelvic bone lesions are seen. SI joints are approximated. Lower lumbar spine grossly unremarkable.   IMPRESSION: 1. Age-indeterminate linear lucency traversing the left pubis symphysis. While this finding may be chronic in nature or perhaps related to summation of shadows, possible acute fracture is not excluded. Correlation with physical exam for possible fracture at this site recommended. 2. No other acute traumatic injury within the pelvis.   Electronically Signed   By: Rise MuBenjamin  McClintock M.D.   On: 03/15/2014 01:51   Dg Elbow Complete Left  03/15/2014   CLINICAL DATA:  Fall.  Elbow laceration.  EXAM: LEFT ELBOW - COMPLETE 3+ VIEW  COMPARISON:  None.  FINDINGS: Cortical offset along the radial neck, only visualized in the frontal projection. Prominent anterior elbow fat pad suggesting small joint effusion. No malalignment.  IMPRESSION: Suspect a nondisplaced radial neck fracture.   Electronically Signed   By: Tiburcio Pea M.D.   On: 03/15/2014 01:49   Ct Head Wo Contrast  03/15/2014   CLINICAL DATA:  Fall.  EXAM: CT HEAD WITHOUT CONTRAST  CT CERVICAL SPINE WITHOUT CONTRAST  TECHNIQUE: Multidetector CT imaging of the head and cervical spine was performed following the standard protocol without intravenous contrast. Multiplanar CT image reconstructions of the cervical spine were also generated.  COMPARISON:  09/04/09  FINDINGS: CT HEAD FINDINGS  Prominence of the sulci and ventricles consistent with brain atrophy. Chronic right basal ganglia lacunar infarct noted. No evidence for acute intracranial hemorrhage, infarct or mass. The paranasal sinuses are clear. The mastoid air cells are clear. The calvarium appears intact.  CT CERVICAL SPINE FINDINGS  Straightening of normal cervical lordosis. The vertebral body heights are well preserved. There is multi level disc space narrowing and ventral endplate spurring compatible with degenerative disc disease. The facet joints are aligned bilaterally. There is a nondisplaced fracture involving the posterior arch of C1 on the left, image 28/series 8. There is a  nondisplaced base of dens fracture which extends into the left side of the vertebral body. Comminuted fracture deformity involves the left-sided transverse process of C2 with mild posterior displacement of fracture fragments into the canal of the left vertebral artery. The left posterior arch of C3 fracture is also noted lower cervical spine appears intact.  IMPRESSION: 1. No acute intracranial abnormalities. 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 2. Base of dens fracture which extends into the left side of the vertebral body. 3. Comminuted fracture involving the left transverse process of the C2 vertebra with possible involvement of the vertebral canal 4. C3 Left lamina fracture and C1 left posterior arch fracture, nondisplaced 5. Cervical degenerative disc disease. 6. Critical Value/emergent results were called by telephone at the time of interpretation on 03/15/2014 at 2:01 am to Dr. Warnell Forester , who verbally acknowledged these results.   Electronically Signed   By: Signa Kell M.D.   On: 03/15/2014 02:03   Ct Angio Neck W/cm &/or Wo/cm  03/15/2014   EXAM: CT ANGIOGRAPHY NECK  TECHNIQUE: Multidetector CT imaging of the neck was performed using the standard protocol during bolus administration of intravenous contrast. Multiplanar CT image reconstructions and MIPs were obtained to evaluate the vascular anatomy. Carotid stenosis measurements (when applicable) are obtained utilizing NASCET criteria, using the distal internal carotid diameter as the denominator.  CONTRAST:  50mL OMNIPAQUE IOHEXOL 350 MG/ML SOLN  COMPARISON:  Prior CT of the cervical spine performed earlier on the same day.  FINDINGS: Scattered atheromatous plaque present within the visualize aortic arch which is of normal caliber. Aberrant right subclavian artery noted. No high-grade stenosis seen at the origin of the great vessels. Subclavian arteries well opacified bilaterally.  There is a common origin of the common  carotid arteries from the aortic arch. Common carotid arteries are tortuous in medialized into the mid neck and retropharyngeal space. No hemodynamically significant stenosis or evidence of traumatic injury identified within the common carotid arteries bilaterally. Carotid bifurcations within normal limits.  Internal carotid arteries are well opacified to the skullbase without evidence of dissection, occlusion, or hemodynamically significant stenosis. Visualized portions of the circle of Willis are unremarkable.  External carotid arteries and their branches are within normal  limits.  Both vertebral arteries arise from the subclavian arteries. The left vertebral artery is dominant. The right vertebral artery is well opacified along its entire course without evidence of acute traumatic injury, dissection, or stenosis.  Mild mass effect on the left V2/V3 segment as it courses by the fracture fragments at the level of the left transverse foramen. The vertebral artery delete is narrowed by approximately 50%, which may in part be related to Vasa spasm due to the adjacent fracture. This is best seen on sagittal projection (series 404, image 150). No dissection flap or pseudoaneurysm identified. Distally, the left vertebral artery is well opacified to the level of the vertebrobasilar junction.  Previously identified cervical spine fractures again noted, better characterized on prior CT. No other soft tissue abnormality within the neck.  No acute soft tissue abnormality identified within the neck. Thyroid gland within normal limits. Emphysematous changes noted within the visualized lungs.  IMPRESSION: 1. Focal narrowing of approximately 50% of the left vertebral artery at the level of the left C2 vertebral body fractures. This finding may in part be related to vasospasm due to the adjacent fracture. No frank dissection flap identified. 2. No other acute traumatic injury to the major arterial vasculature of the neck. 3.  Stable cervical spine fractures, better evaluated on prior CT of the cervical spine. Results were called by telephone at the time of interpretation on 03/15/2014 at 4:16 am to Dr. Warnell Forester , who verbally acknowledged these results.   Electronically Signed   By: Rise Mu M.D.   On: 03/15/2014 04:17   Ct Chest W Contrast  03/15/2014   CLINICAL DATA:  Initial evaluation for acute trauma. Fall down 10 steps.  EXAM: CT CHEST, ABDOMEN, AND PELVIS WITH CONTRAST  TECHNIQUE: Multidetector CT imaging of the chest, abdomen and pelvis was performed following the standard protocol during bolus administration of intravenous contrast.  CONTRAST:  50 cc of Omni 300.  COMPARISON:  None.  FINDINGS: CT CHEST FINDINGS  Scattered shotty bilateral hilar lymph nodes are noted. No pathologically enlarged mediastinal, hilar, or axillary lymph nodes are identified.  Intrathoracic aorta is of normal caliber and appearance. Scattered calcified plaque present within the aortic arch. No evidence for acute traumatic aortic injury. No mediastinal hematoma.  Heart size is normal.  No pericardial effusion.  Evaluation of the lungs is somewhat limited by motion artifact. Upper lobe predominant centrilobular emphysema present. Atelectatic changes seen dependently within the bilateral lower lobes. No pneumothorax. No focal infiltrates to suggest pulmonary contusion or infection. No pulmonary edema or pleural effusion.  Compression deformity at the superior endplate of T3 is suspicious for possible acute fracture (series 204, image 71). There is approximately 30% of height loss without significant retropulsion. There is an additional compression deformity of the T5 vertebral body with approximately 20-30% of height loss with a retropulsion, also suspicious for acute fracture. No other acute fracture or other osseous abnormality within the thorax.  CT ABDOMEN AND PELVIS FINDINGS  Scattered subcentimeter hypodensities within the spleen  are noted, too small the characterize by CT, but may reflect small cyst. A 9 mm hyperdense lesion within the left hepatic lobe is noted, indeterminate, but likely a small benign hemangioma. Liver is otherwise unremarkable. Gallbladder within normal limits. No biliary dilatation. Spleen is intact. No perisplenic hematoma. Adrenal glands and pancreas are within normal limits.  Kidneys are equal in size with symmetric enhancement. No nephrolithiasis, hydronephrosis, or focal enhancing renal mass.  Stomach within normal limits. No evidence  for bowel obstruction or acute bowel injury. No abnormal wall thickening, mucosal enhancement, or inflammatory fat stranding seen about the bowels. Prominent fecal impaction present within the rectal vault.  Bladder intact an within normal limits. Uterus and ovaries are not visualized.  No adenopathy. No free air or fluid. Mild mesenteric edema noted centrally within the abdomen, of uncertain clinical significance.  Moderate atheromatous plaque present within the infrarenal aorta without associated aneurysm. No contrast extravasation. No mesenteric or retroperitoneal hematoma.  No acute fracture within the pelvis. No spinal fracture within the abdomen and pelvis. Degenerative changes noted about the L4-5 intervertebral disc space.  IMPRESSION: 1. Compression deformities of the T3 and T5 vertebral bodies with associated 20-30% of height loss as above, suspicious for acute compression fractures. No significant retropulsion. 2. No other acute traumatic injury within the chest, abdomen, and pelvis. 3. Prominent fecal impaction within the rectal vault without associated obstruction.   Electronically Signed   By: Rise Mu M.D.   On: 03/15/2014 05:38   Ct Cervical Spine Wo Contrast  03/15/2014   CLINICAL DATA:  Fall.  EXAM: CT HEAD WITHOUT CONTRAST  CT CERVICAL SPINE WITHOUT CONTRAST  TECHNIQUE: Multidetector CT imaging of the head and cervical spine was performed following  the standard protocol without intravenous contrast. Multiplanar CT image reconstructions of the cervical spine were also generated.  COMPARISON:  09/04/09  FINDINGS: CT HEAD FINDINGS  Prominence of the sulci and ventricles consistent with brain atrophy. Chronic right basal ganglia lacunar infarct noted. No evidence for acute intracranial hemorrhage, infarct or mass. The paranasal sinuses are clear. The mastoid air cells are clear. The calvarium appears intact.  CT CERVICAL SPINE FINDINGS  Straightening of normal cervical lordosis. The vertebral body heights are well preserved. There is multi level disc space narrowing and ventral endplate spurring compatible with degenerative disc disease. The facet joints are aligned bilaterally. There is a nondisplaced fracture involving the posterior arch of C1 on the left, image 28/series 8. There is a nondisplaced base of dens fracture which extends into the left side of the vertebral body. Comminuted fracture deformity involves the left-sided transverse process of C2 with mild posterior displacement of fracture fragments into the canal of the left vertebral artery. The left posterior arch of C3 fracture is also noted lower cervical spine appears intact.  IMPRESSION: 1. No acute intracranial abnormalities. 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 2. Base of dens fracture which extends into the left side of the vertebral body. 3. Comminuted fracture involving the left transverse process of the C2 vertebra with possible involvement of the vertebral canal 4. C3 Left lamina fracture and C1 left posterior arch fracture, nondisplaced 5. Cervical degenerative disc disease. 6. Critical Value/emergent results were called by telephone at the time of interpretation on 03/15/2014 at 2:01 am to Dr. Warnell Forester , who verbally acknowledged these results.   Electronically Signed   By: Signa Kell M.D.   On: 03/15/2014 02:03   Ct Abdomen Pelvis W Contrast  03/15/2014    CLINICAL DATA:  Initial evaluation for acute trauma. Fall down 10 steps.  EXAM: CT CHEST, ABDOMEN, AND PELVIS WITH CONTRAST  TECHNIQUE: Multidetector CT imaging of the chest, abdomen and pelvis was performed following the standard protocol during bolus administration of intravenous contrast.  CONTRAST:  50 cc of Omni 300.  COMPARISON:  None.  FINDINGS: CT CHEST FINDINGS  Scattered shotty bilateral hilar lymph nodes are noted. No pathologically enlarged mediastinal, hilar, or axillary lymph nodes are  identified.  Intrathoracic aorta is of normal caliber and appearance. Scattered calcified plaque present within the aortic arch. No evidence for acute traumatic aortic injury. No mediastinal hematoma.  Heart size is normal.  No pericardial effusion.  Evaluation of the lungs is somewhat limited by motion artifact. Upper lobe predominant centrilobular emphysema present. Atelectatic changes seen dependently within the bilateral lower lobes. No pneumothorax. No focal infiltrates to suggest pulmonary contusion or infection. No pulmonary edema or pleural effusion.  Compression deformity at the superior endplate of T3 is suspicious for possible acute fracture (series 204, image 71). There is approximately 30% of height loss without significant retropulsion. There is an additional compression deformity of the T5 vertebral body with approximately 20-30% of height loss with a retropulsion, also suspicious for acute fracture. No other acute fracture or other osseous abnormality within the thorax.  CT ABDOMEN AND PELVIS FINDINGS  Scattered subcentimeter hypodensities within the spleen are noted, too small the characterize by CT, but may reflect small cyst. A 9 mm hyperdense lesion within the left hepatic lobe is noted, indeterminate, but likely a small benign hemangioma. Liver is otherwise unremarkable. Gallbladder within normal limits. No biliary dilatation. Spleen is intact. No perisplenic hematoma. Adrenal glands and pancreas are  within normal limits.  Kidneys are equal in size with symmetric enhancement. No nephrolithiasis, hydronephrosis, or focal enhancing renal mass.  Stomach within normal limits. No evidence for bowel obstruction or acute bowel injury. No abnormal wall thickening, mucosal enhancement, or inflammatory fat stranding seen about the bowels. Prominent fecal impaction present within the rectal vault.  Bladder intact an within normal limits. Uterus and ovaries are not visualized.  No adenopathy. No free air or fluid. Mild mesenteric edema noted centrally within the abdomen, of uncertain clinical significance.  Moderate atheromatous plaque present within the infrarenal aorta without associated aneurysm. No contrast extravasation. No mesenteric or retroperitoneal hematoma.  No acute fracture within the pelvis. No spinal fracture within the abdomen and pelvis. Degenerative changes noted about the L4-5 intervertebral disc space.  IMPRESSION: 1. Compression deformities of the T3 and T5 vertebral bodies with associated 20-30% of height loss as above, suspicious for acute compression fractures. No significant retropulsion. 2. No other acute traumatic injury within the chest, abdomen, and pelvis. 3. Prominent fecal impaction within the rectal vault without associated obstruction.   Electronically Signed   By: Rise Mu M.D.   On: 03/15/2014 05:38    Jeralyn Bennett, MD  [facility] Pager:[phone]  If 7PM-7AM, please contact night-coverage [email or web address] Password [email or web address] 03/20/2014, 10:56 AM   LOS: 6 days

## 2014-03-20 NOTE — Plan of Care (Signed)
Problem: Phase III Progression Outcomes Goal: Discharge plan remains appropriate-arrangements made Outcome: Completed/Met Date Met:  03/20/14 Goal: Other Phase III Outcomes/Goals Outcome: Completed/Met Date Met:  03/20/14

## 2014-03-20 NOTE — Progress Notes (Signed)
CARE MANAGEMENT NOTE 03/20/2014  Patient:  Bari EdwardICHOLSON,Eufemia L   Account Number:  [id number]  Date Initiated:  03/16/2014  Documentation initiated by:  Jiles CrockerHANDLER,BRENDA  Subjective/Objective Assessment:   ADMITTED WITH NECK AND BACK FRACTURE     Action/Plan:   PLAN TO DISCHARGE HOME WITH DAUGHTER AND [facility]   Anticipated DC Date:  03/20/2014   Anticipated DC Plan:  HOME W HOME HEALTH SERVICES      DC Planning Services  CM consult      Choice offered to / List presented to:  C-4 Adult Children   DME arranged  [facility]      DME agency  [facility]     [facility]  [facility] RN  HH-2 PT  HH-3 OT  HH-4 NURSE'S AIDE  HH-6 SOCIAL WORKER      HH agency  [facility]   Status of service:  Completed, signed off Medicare Important Message given?  YES (If response is "NO", the following Medicare IM given date fields will be blank) Date Medicare IM given:  03/19/2014 Medicare IM given by:  [facility]AV[facility],Nykayla Marcelli Date Additional Medicare IM given:   Additional Medicare IM given by:    Discharge Disposition:  HOME W HOME HEALTH SERVICES  Per UR Regulation:  Reviewed for med. necessity/level of care/duration of stay  If discussed at Long Length of Stay Meetings, dates discussed:    Comments:  03/20/2014 1100 [facility]. Attempt was made to reach family on 03/19/2014 for delivery of hopsital bed and they could not reach family. Plan is for delivery of bed today from 2-6 pm. NCM spoke to dtr and states she is not sure what happened. States she does [facility]. Isidoro DonningAlesia Dagan Heinz RN CCM Case Mgmt phone [phone]  03/19/2014 1500 Notified [facility] of order for hospital bed for scheduled dc home today. Notified [facility] rep for HH. Isidoro DonningAlesia Cristella Stiver RN CCM Case Mgmt phone [phone]  03/16/2014- Talked to patient's daughter Doreen BeamKristene [phone]( [phone]) about HHC choices, Barkley BrunsKristine [facility];  Marie with [facility] called for arrangements; Alexis GoodellB Chandler RN,BSN,MHA [phone]

## 2014-03-20 NOTE — Plan of Care (Signed)
Problem: Phase II Progression Outcomes Goal: Progress activity as tolerated unless otherwise ordered Outcome: Completed/Met Date Met:  03/20/14     

## 2014-03-20 NOTE — Plan of Care (Signed)
Problem: Phase I Progression Outcomes Goal: OOB as tolerated unless otherwise ordered Outcome: Completed/Met Date Met:  03/20/14     

## 2014-03-29 ENCOUNTER — Encounter (HOSPITAL_COMMUNITY): Payer: Self-pay | Admitting: Internal Medicine

## 2014-04-02 ENCOUNTER — Ambulatory Visit (INDEPENDENT_AMBULATORY_CARE_PROVIDER_SITE_OTHER): Payer: Medicare Other | Admitting: *Deleted

## 2014-04-02 DIAGNOSIS — R55 Syncope and collapse: Secondary | ICD-10-CM

## 2014-04-02 LAB — MDC_IDC_ENUM_SESS_TYPE_INCLINIC

## 2014-04-02 NOTE — Progress Notes (Signed)
ILR wound check.   Steri strips removed.  Wound well healed.  No episodes noted.  R-waves .48-.1761mV.  ROV in March with Dr. Graciela HusbandsKlein.

## 2014-04-09 ENCOUNTER — Other Ambulatory Visit: Payer: Self-pay | Admitting: Nurse Practitioner

## 2014-04-09 DIAGNOSIS — T148XXA Other injury of unspecified body region, initial encounter: Secondary | ICD-10-CM

## 2014-04-11 ENCOUNTER — Ambulatory Visit
Admission: RE | Admit: 2014-04-11 | Discharge: 2014-04-11 | Disposition: A | Discharge status: Home / Self Care | Payer: Medicare Other | Source: Ambulatory Visit | Attending: Nurse Practitioner | Admitting: Nurse Practitioner

## 2014-04-11 DIAGNOSIS — T148XXA Other injury of unspecified body region, initial encounter: Secondary | ICD-10-CM

## 2014-04-16 ENCOUNTER — Emergency Department (HOSPITAL_COMMUNITY)
Admission: EM | Admit: 2014-04-16 | Discharge: 2014-04-17 | Disposition: A | Discharge status: Home / Self Care | Payer: Medicare Other | Attending: Emergency Medicine | Admitting: Emergency Medicine

## 2014-04-16 ENCOUNTER — Encounter (HOSPITAL_COMMUNITY): Payer: Self-pay | Admitting: Emergency Medicine

## 2014-04-16 DIAGNOSIS — W1839XD Other fall on same level, subsequent encounter: Secondary | ICD-10-CM | POA: Insufficient documentation

## 2014-04-16 DIAGNOSIS — E039 Hypothyroidism, unspecified: Secondary | ICD-10-CM | POA: Insufficient documentation

## 2014-04-16 DIAGNOSIS — Z87891 Personal history of nicotine dependence: Secondary | ICD-10-CM | POA: Diagnosis not present

## 2014-04-16 DIAGNOSIS — S12100G Unspecified displaced fracture of second cervical vertebra, subsequent encounter for fracture with delayed healing: Secondary | ICD-10-CM | POA: Diagnosis not present

## 2014-04-16 DIAGNOSIS — F0390 Unspecified dementia without behavioral disturbance: Secondary | ICD-10-CM | POA: Insufficient documentation

## 2014-04-16 DIAGNOSIS — M542 Cervicalgia: Secondary | ICD-10-CM | POA: Diagnosis present

## 2014-04-16 DIAGNOSIS — Z79899 Other long term (current) drug therapy: Secondary | ICD-10-CM | POA: Diagnosis not present

## 2014-04-16 DIAGNOSIS — W19XXXA Unspecified fall, initial encounter: Secondary | ICD-10-CM

## 2014-04-16 NOTE — ED Provider Notes (Signed)
CSN: [id number]     Arrival date & time 04/16/14  2319 History   First MD Initiated Contact with Patient 04/16/14 2344     No chief complaint on file.    (Consider location/radiation/quality/duration/timing/severity/associated sxs/prior Treatment) HPI Brandi Schneider is a 68 y.o. female with hx of dementia, presents to ED with family with complaint of broken cervical colar. Pt is non verbal due to dementia. Pt with multiple cervical spine fractures on 03/15/14 after a fall. Pt is [place] collar since. Apparently pt had a fall about a week ago. Unknown cause of a fall. Family did talk to her doctor and cardiologist, but has had any issues since then. At baseline mental status. Family noticed today that her neck brace was broken. Family is now concerned that pt may have injured herself worse when she fell a week ago and requesting new brace.   Past Medical History  Diagnosis Date  . Dementia   . Hypothyroid   . Hypercholesteremia    Past Surgical History  Procedure Laterality Date  . Hysterotomy    . Loop recorder implant N/A 03/19/2014    Procedure: LOOP RECORDER IMPLANT;  Surgeon: Duke SalviaSteven C Klein, MD;  [facility];  Service: Cardiovascular;  Laterality: N/A;   Family History  Problem Relation Age of Onset  . Breast cancer Mother   . Stroke Father    History  Substance Use Topics  . Smoking status: Former Games developermoker  . Smokeless tobacco: Never Used  . Alcohol Use: No   OB History    No data available     Review of Systems  Unable to perform ROS: Dementia      Allergies  Aricept  Home Medications   Prior to Admission medications   Medication Sig Start Date End Date Taking? Authorizing Provider  cholecalciferol (VITAMIN D) 1000 UNITS tablet Take 1,000 Units by mouth daily.    Historical Provider, MD  food thickener (THICK IT) POWD Take 1 Container by mouth as needed (to thicken liquids). 03/20/14   Jeralyn BennettEzequiel Zamora, MD  levothyroxine (SYNTHROID,  LEVOTHROID) 75 MCG tablet Take 75 mcg by mouth daily before breakfast.    Historical Provider, MD  memantine (NAMENDA) 10 MG tablet Take 10 mg by mouth 2 (two) times daily.    Historical Provider, MD  mirabegron ER (MYRBETRIQ) 25 MG TB24 Take 25 mg by mouth daily.    Historical Provider, MD  mirtazapine (REMERON) 15 MG tablet Take 15 mg by mouth at bedtime.    Historical Provider, MD   BP 125/87 mmHg  Pulse 90  Temp(Src) 98.2 F (36.8 C) (Axillary)  Resp 18  SpO2 94% Physical Exam  Constitutional: She appears well-developed and well-nourished. No distress.  Eyes: Conjunctivae are normal.  Neck:  c cpine in [place] collar. Unable to assess tenderness due to pt not following directions, non verbal.  Cardiovascular: Normal rate, regular rhythm and normal heart sounds.   Pulmonary/Chest: Effort normal and breath sounds normal. No respiratory distress. She has no wheezes.  Abdominal: There is no tenderness.  Musculoskeletal: She exhibits no edema.  Neurological: She is alert.  Skin: Skin is warm and dry.  Nursing note and vitals reviewed.   ED Course  Procedures (including critical care time) Labs Review Labs Reviewed - No data to display  Imaging Review No results found.   EKG Interpretation None      MDM   Final diagnoses:  Fall  C2 cervical fracture, with delayed healing, subsequent encounter  Patient with recent fall, known cervical spine fractures. C-collar is broken from the fall. Will replace. CT of the cervical spine and of the head pending.  Patient signed out to PA Tysinger pending CTs.  Filed Vitals:   04/16/14 2335 04/17/14 0225 04/17/14 0328  BP: 125/87 136/94 134/88  Pulse: 90 83 84  Temp: 98.2 F (36.8 C) 98.3 F (36.8 C) 98.2 F (36.8 C)  TempSrc: Axillary Oral Oral  Resp: 18 20 14   SpO2: 94% 95% 96%     Lottie Musselatyana A Ellean Firman, PA-C 04/23/14 2318  Gilda Creasehristopher J. Pollina, MD 04/26/14 [phone]

## 2014-04-16 NOTE — ED Notes (Signed)
Pt. requesting c- collar  replacement , collar became loose at velcro this week .

## 2014-04-17 ENCOUNTER — Emergency Department (HOSPITAL_COMMUNITY): Payer: Medicare Other

## 2014-04-17 MED ORDER — HYDROCODONE-ACETAMINOPHEN 5-325 MG PO TABS: 1.0000 | ORAL_TABLET | Freq: Four times a day (QID) | ORAL | Status: AC | PRN

## 2014-04-17 NOTE — ED Provider Notes (Signed)
1:35 AM: Received hand-off report from [employer] Financial Corporationatyana Kirichenko, PA-C. Plan includes reviewing results of C-spine CT.  Pt currently alert and resting without distress. Pt is non-verbal at baseline but can move all extremities well and can follow commands.  2:25 AM: CT results reviewed, discussed case with Dr. Anitra LauthPlunkett.  Will consult neurosurgery.  3:05 AM: Consulted Dr. Dutch QuintPoole (neurosurgery) who reviewed CT images. Recommends no acute intervention but pt may be discharged with new c-collar and follow-up in the office with Dr. Yetta BarreJones. Pt is well-appearing, in no acute distress and vital signs are stable.  They appear safe to be discharged.  Discharge include follow-up with their PCP.  Return precautions provided.   Plan discussed with pt and family who are aware of plan and in agreement.   Filed Vitals:   04/16/14 2335 04/17/14 0225 04/17/14 0328  BP: 125/87 136/94 134/88  Pulse: 90 83 84  Temp: 98.2 F (36.8 C) 98.3 F (36.8 C) 98.2 F (36.8 C)  TempSrc: Axillary Oral Oral  Resp: 18 20 14   SpO2: 94% 95% 96%       Harle BattiestElizabeth Jayten Gabbard, NP 04/17/14 1750  Gwyneth SproutWhitney Plunkett, MD 04/18/14 [phone]

## 2014-04-17 NOTE — Discharge Instructions (Signed)
Please follow the directions provided.  Be sure to follow-up with Dr. Yetta BarreJones for further management of this displaced fracture.  You may take one vicodin every 6 hours to help with pain.  Please be careful to avoid future falls.  Don't hesitate to return for any new, worsening or concerning symptoms.    SEEK IMMEDIATE MEDICAL CARE IF:  You have increasing pain in your neck.  You develop difficulties swallowing or breathing.  You develop swelling in your neck.  You have numbness, weakness, burning pain, or movement problems in the arms or legs.  You are unable to control your bowel or bladder (incontinence).  You have problems with coordination or difficulty walking.

## 2014-04-18 ENCOUNTER — Encounter: Payer: Self-pay | Admitting: Internal Medicine

## 2014-04-18 ENCOUNTER — Ambulatory Visit (INDEPENDENT_AMBULATORY_CARE_PROVIDER_SITE_OTHER): Payer: Medicare Other | Admitting: *Deleted

## 2014-04-18 DIAGNOSIS — R55 Syncope and collapse: Secondary | ICD-10-CM

## 2014-04-19 NOTE — Progress Notes (Signed)
Loop recorder 

## 2014-04-27 ENCOUNTER — Telehealth: Payer: Self-pay | Admitting: Internal Medicine

## 2014-04-27 NOTE — Telephone Encounter (Signed)
Linq reviewed-no episodes recorded since the device was implanted. Daughter voiced understanding.

## 2014-04-27 NOTE — Telephone Encounter (Signed)
LMTCB//sss 

## 2014-04-27 NOTE — Telephone Encounter (Signed)
New message      1. Has your device fired? no 2. Is you device beeping?no  3. Are you experiencing draining or swelling at device site?no  4. Are you calling to see if we received your device transmission? no 5. Have you passed out? no Patient fell 04-10-15. Daughter called and wanted us to check her transmission for the wrong date.  Please check pacemaker transmission for 04-10-15 and see if anything weird printed.  Pt fell---did not pass out.

## 2014-05-04 LAB — MDC_IDC_ENUM_SESS_TYPE_REMOTE

## 2014-05-18 ENCOUNTER — Other Ambulatory Visit: Payer: Self-pay | Admitting: Neurosurgery

## 2014-05-18 ENCOUNTER — Ambulatory Visit (INDEPENDENT_AMBULATORY_CARE_PROVIDER_SITE_OTHER): Payer: Medicare Other | Admitting: *Deleted

## 2014-05-18 DIAGNOSIS — R55 Syncope and collapse: Secondary | ICD-10-CM

## 2014-05-18 DIAGNOSIS — S12121D Other nondisplaced dens fracture, subsequent encounter for fracture with routine healing: Secondary | ICD-10-CM

## 2014-05-22 NOTE — Progress Notes (Signed)
Loop recorder 

## 2014-05-29 ENCOUNTER — Encounter: Payer: Self-pay | Admitting: Internal Medicine

## 2014-05-29 ENCOUNTER — Emergency Department (HOSPITAL_COMMUNITY)
Admission: EM | Admit: 2014-05-29 | Discharge: 2014-05-29 | Disposition: A | Discharge status: Home / Self Care | Payer: Medicare Other | Attending: Emergency Medicine | Admitting: Emergency Medicine

## 2014-05-29 ENCOUNTER — Encounter (HOSPITAL_COMMUNITY): Payer: Self-pay | Admitting: *Deleted

## 2014-05-29 DIAGNOSIS — F0390 Unspecified dementia without behavioral disturbance: Secondary | ICD-10-CM | POA: Diagnosis not present

## 2014-05-29 DIAGNOSIS — E039 Hypothyroidism, unspecified: Secondary | ICD-10-CM | POA: Diagnosis not present

## 2014-05-29 DIAGNOSIS — Z79899 Other long term (current) drug therapy: Secondary | ICD-10-CM | POA: Insufficient documentation

## 2014-05-29 DIAGNOSIS — R2242 Localized swelling, mass and lump, left lower limb: Secondary | ICD-10-CM | POA: Insufficient documentation

## 2014-05-29 DIAGNOSIS — M7989 Other specified soft tissue disorders: Secondary | ICD-10-CM

## 2014-05-29 LAB — CBC WITH DIFFERENTIAL/PLATELET
Basophils Absolute: 0 10*3/uL (ref 0.0–0.1)
Basophils Relative: 1 % (ref 0–1)
Eosinophils Absolute: 0.1 10*3/uL (ref 0.0–0.7)
Eosinophils Relative: 2 % (ref 0–5)
HEMATOCRIT: 42.8 % (ref 36.0–46.0)
HEMOGLOBIN: 13.9 g/dL (ref 12.0–15.0)
LYMPHS ABS: 1.2 10*3/uL (ref 0.7–4.0)
Lymphocytes Relative: 21 % (ref 12–46)
MCH: 30.3 pg (ref 26.0–34.0)
MCHC: 32.5 g/dL (ref 30.0–36.0)
MCV: 93.2 fL (ref 78.0–100.0)
MONO ABS: 0.5 10*3/uL (ref 0.1–1.0)
Monocytes Relative: 10 % (ref 3–12)
NEUTROS ABS: 3.8 10*3/uL (ref 1.7–7.7)
NEUTROS PCT: 66 % (ref 43–77)
Platelets: 236 10*3/uL (ref 150–400)
RBC: 4.59 MIL/uL (ref 3.87–5.11)
RDW: 14.2 % (ref 11.5–15.5)
WBC: 5.7 10*3/uL (ref 4.0–10.5)

## 2014-05-29 LAB — I-STAT CHEM 8, ED
BUN: 16 mg/dL (ref 6–23)
CALCIUM ION: 1.22 mmol/L (ref 1.13–1.30)
CREATININE: 0.8 mg/dL (ref 0.50–1.10)
Chloride: 104 mmol/L (ref 96–112)
GLUCOSE: 122 mg/dL — AB (ref 70–99)
HCT: 45 % (ref 36.0–46.0)
Hemoglobin: 15.3 g/dL — ABNORMAL HIGH (ref 12.0–15.0)
POTASSIUM: 4.4 mmol/L (ref 3.5–5.1)
Sodium: 140 mmol/L (ref 135–145)
TCO2: 24 mmol/L (ref 0–100)

## 2014-05-29 LAB — COMPREHENSIVE METABOLIC PANEL
ALT: 16 U/L (ref 0–35)
AST: 29 U/L (ref 0–37)
Albumin: 4.1 g/dL (ref 3.5–5.2)
Alkaline Phosphatase: 57 U/L (ref 39–117)
Anion gap: 10 (ref 5–15)
BUN: 14 mg/dL (ref 6–23)
CALCIUM: 9.8 mg/dL (ref 8.4–10.5)
CO2: 23 mmol/L (ref 19–32)
CREATININE: 0.72 mg/dL (ref 0.50–1.10)
Chloride: 105 mmol/L (ref 96–112)
GFR calc Af Amer: >90 mL/min (ref >90)
GFR, EST NON AFRICAN AMERICAN: 87 mL/min — AB (ref >90)
Glucose, Bld: 121 mg/dL — ABNORMAL HIGH (ref 70–99)
Potassium: 4.4 mmol/L (ref 3.5–5.1)
Sodium: 138 mmol/L (ref 135–145)
TOTAL PROTEIN: 7 g/dL (ref 6.0–8.3)
Total Bilirubin: 0.4 mg/dL (ref 0.3–1.2)

## 2014-05-29 NOTE — ED Notes (Signed)
Lt. Ankle swollen for a few days. Their is heat going from chin to knee.  She fell down stairs in nov. Family states, "some nose bleeding - little bit." but no bleeding now.

## 2014-05-29 NOTE — Discharge Instructions (Signed)
IMPORTANT PATIENT INSTRUCTIONS:   You have been scheduled for an Outpatient [facility] at [facility].     If tomorrow is a weekday (Monday - Friday), please go to the [facility] Admitting Department at 8 AM and tell them you are therefore a vascular study

## 2014-05-29 NOTE — ED Provider Notes (Signed)
CSN: [id number]     Arrival date & time 05/29/14  1752 History   First MD Initiated Contact with Patient 05/29/14 2036     Chief Complaint  Patient presents with  . Cellulitis  . Joint Swelling     Level V caveat: Dementia  HPI Patient is brought to the emergency department by the daughter who is the primary care provider as the patient's had some increased swelling of her left lower extremity and the daughter was initially concerned that there could be some warmth to the left lower leg.  No fevers or chills.  Appetite is normal.  No change in mental status.  The daughter's concern about the possibility of a DVT.  No prior history of DVT or pulmonary embolism.  She has had mild bilateral lower extremity left greater than right.  The daughter.  The daughter raised her legs yesterday and states it looks slightly better.  The patient is much less mobile over the past 6 months ago but she still is able to get around somewhat. No SOB. No redness of the legs noted by the daughter   Past Medical History  Diagnosis Date  . Dementia   . Hypothyroid   . Hypercholesteremia    Past Surgical History  Procedure Laterality Date  . Hysterotomy    . Loop recorder implant N/A 03/19/2014    Procedure: LOOP RECORDER IMPLANT;  Surgeon: Duke SalviaSteven C Klein, MD;  [facility];  Service: Cardiovascular;  Laterality: N/A;   Family History  Problem Relation Age of Onset  . Breast cancer Mother   . Stroke Father    History  Substance Use Topics  . Smoking status: Former Games developermoker  . Smokeless tobacco: Never Used  . Alcohol Use: No   OB History    No data available     Review of Systems  Unable to perform ROS     Allergies  Aricept  Home Medications   Prior to Admission medications   Medication Sig Start Date End Date Taking? Authorizing Provider  cholecalciferol (VITAMIN D) 1000 UNITS tablet Take 1,000 Units by mouth daily.    Historical Provider, MD  food thickener (THICK IT) POWD Take  1 Container by mouth as needed (to thicken liquids). 03/20/14   Jeralyn BennettEzequiel Zamora, MD  HYDROcodone-acetaminophen (NORCO/VICODIN) 5-325 MG per tablet Take 1 tablet by mouth every 6 (six) hours as needed for moderate pain or severe pain. 04/17/14   Harle BattiestElizabeth Tysinger, NP  levothyroxine (SYNTHROID, LEVOTHROID) 75 MCG tablet Take 75 mcg by mouth daily before breakfast.    Historical Provider, MD  memantine (NAMENDA) 10 MG tablet Take 10 mg by mouth 2 (two) times daily.    Historical Provider, MD  mirabegron ER (MYRBETRIQ) 25 MG TB24 Take 25 mg by mouth daily.    Historical Provider, MD  mirtazapine (REMERON) 15 MG tablet Take 15 mg by mouth at bedtime.    Historical Provider, MD   BP 112/70 mmHg  Pulse 114  Temp(Src) 98.3 F (36.8 C)  Resp 18  SpO2 93% Physical Exam  Constitutional: She is oriented to person, place, and time. She appears well-developed and well-nourished. No distress.  HENT:  Head: Normocephalic and atraumatic.  Eyes: EOM are normal.  Neck: Normal range of motion.  Cardiovascular: Normal rate, regular rhythm and normal heart sounds.   Pulmonary/Chest: Effort normal and breath sounds normal.  Abdominal: Soft. She exhibits no distension. There is no tenderness.  Musculoskeletal: Normal range of motion. She exhibits no tenderness.  No erythema of her legs.  No warmth of her legs.  Normal PT and DP pulses bilaterally.  0.5-1+ edema bilaterally left slightly greater than right.  Neurological: She is alert and oriented to person, place, and time.  Skin: Skin is warm and dry.  Psychiatric: She has a normal mood and affect. Judgment normal.  Nursing note and vitals reviewed.   ED Course  Procedures (including critical care time) Labs Review Labs Reviewed  COMPREHENSIVE METABOLIC PANEL - Abnormal; Notable for the following:    Glucose, Bld 121 (*)    GFR calc non Af Amer 87 (*)    All other components within normal limits  I-STAT CHEM 8, ED - Abnormal; Notable for the  following:    Glucose, Bld 122 (*)    Hemoglobin 15.3 (*)    All other components within normal limits  CBC WITH DIFFERENTIAL/PLATELET    Imaging Review No results found.   EKG Interpretation None      MDM   Final diagnoses:  Left leg swelling   No signs of infection at this time.  Family is concerned about the possibility of DVT however my suspicion is somewhat low.  Given the family's concern I will order an outpatient duplex of her bilateral lower extremities tomorrow.  I suspect this is negative.  I think the patient is low enough risk that I would not want to give her a dose of anticoagulation tonight    Lyanne Co, MD 05/29/14 2124

## 2014-05-30 ENCOUNTER — Ambulatory Visit (HOSPITAL_COMMUNITY)
Admission: RE | Admit: 2014-05-30 | Discharge: 2014-05-30 | Disposition: A | Discharge status: Home / Self Care | Payer: Medicare Other | Source: Ambulatory Visit | Attending: Emergency Medicine | Admitting: Emergency Medicine

## 2014-05-30 DIAGNOSIS — M79609 Pain in unspecified limb: Secondary | ICD-10-CM

## 2014-05-30 DIAGNOSIS — M79604 Pain in right leg: Secondary | ICD-10-CM | POA: Insufficient documentation

## 2014-05-30 DIAGNOSIS — M79605 Pain in left leg: Secondary | ICD-10-CM | POA: Diagnosis not present

## 2014-05-30 NOTE — Progress Notes (Signed)
*  Preliminary Results* Bilateral lower extremity venous duplex completed. Bilateral lower extremities are negative for deep vein thrombosis. There is no evidence of Baker's cyst bilaterally.  05/30/2014  Gertie FeyMichelle Sheena Donegan, RVT, RDCS, RDMS

## 2014-06-07 LAB — MDC_IDC_ENUM_SESS_TYPE_REMOTE
Date Time Interrogation Session: 20160209050827
MDC IDC SET ZONE DETECTION INTERVAL: 2000 ms
Zone Setting Detection Interval: 3000 ms
Zone Setting Detection Interval: 370 ms

## 2014-06-11 ENCOUNTER — Ambulatory Visit
Admission: RE | Admit: 2014-06-11 | Discharge: 2014-06-11 | Disposition: A | Discharge status: Home / Self Care | Payer: Medicare Other | Source: Ambulatory Visit | Attending: Neurosurgery | Admitting: Neurosurgery

## 2014-06-11 DIAGNOSIS — S12121D Other nondisplaced dens fracture, subsequent encounter for fracture with routine healing: Secondary | ICD-10-CM

## 2014-06-18 ENCOUNTER — Ambulatory Visit (INDEPENDENT_AMBULATORY_CARE_PROVIDER_SITE_OTHER): Payer: Medicare Other | Admitting: *Deleted

## 2014-06-18 DIAGNOSIS — R55 Syncope and collapse: Secondary | ICD-10-CM

## 2014-06-18 LAB — MDC_IDC_ENUM_SESS_TYPE_REMOTE
Date Time Interrogation Session: 20160226050048
MDC IDC SET ZONE DETECTION INTERVAL: 3000 ms
MDC IDC SET ZONE DETECTION INTERVAL: 370 ms
Zone Setting Detection Interval: 2000 ms

## 2014-06-21 NOTE — Progress Notes (Signed)
Loop recorder 

## 2014-06-22 ENCOUNTER — Encounter: Payer: Self-pay | Admitting: Cardiology

## 2014-07-03 ENCOUNTER — Encounter: Payer: Self-pay | Admitting: Internal Medicine

## 2014-07-03 ENCOUNTER — Ambulatory Visit (INDEPENDENT_AMBULATORY_CARE_PROVIDER_SITE_OTHER): Payer: Medicare Other | Admitting: Internal Medicine

## 2014-07-03 VITALS — BP 96/52 | HR 98 | Ht 66.0 in | Wt 152.2 lb

## 2014-07-03 DIAGNOSIS — R55 Syncope and collapse: Secondary | ICD-10-CM | POA: Diagnosis not present

## 2014-07-03 DIAGNOSIS — Z4509 Encounter for adjustment and management of other cardiac device: Secondary | ICD-10-CM

## 2014-07-03 LAB — MDC_IDC_ENUM_SESS_TYPE_INCLINIC
Zone Setting Detection Interval: 2000 ms
Zone Setting Detection Interval: 3000 ms
Zone Setting Detection Interval: 370 ms

## 2014-07-03 NOTE — Patient Instructions (Addendum)
Your physician recommends that you schedule a follow-up appointment in: 12 months with Dr. Klein  Your physician recommends that you continue on your current medications as directed. Please refer to the Current Medication list given to you today.   

## 2014-07-03 NOTE — Progress Notes (Signed)
Electrophysiology Office Note   Date:  07/03/2014   ID:  Brandi Schneider, DOB 03-17-1946, MRN [id number]  PCP:  [name], MD  Cardiologist:    Primary Electrophysiologist:   Sherryl Manges, MD    Chief Complaint  Patient presents with  . Appointment    ROV, device check     History of Present Illness: Brandi Schneider is a 69 y.o. female     seen in follow-up for syncope in the context of demonstrated intermittent complete heart block in the context of donepeizil therapy.  With this discontinuation there is no further heart block and a loop recorder was inserted  She is non verbal but has had no recurrent syncope       Past Medical History  Diagnosis Date  . Dementia   . Hypothyroid   . Hypercholesteremia    Past Surgical History  Procedure Laterality Date  . Hysterotomy    . Loop recorder implant N/A 03/19/2014    Procedure: LOOP RECORDER IMPLANT;  Surgeon: Duke Salvia, MD;  Location: [facility] C[facility] LAB;  Service: Cardiovascular;  Laterality: N/A;     Current Outpatient Prescriptions  Medication Sig Dispense Refill  . cholecalciferol (VITAMIN D) 1000 UNITS tablet Take 1,000 Units by mouth daily.    . food thickener (THICK IT) POWD Take 1 Container by mouth as needed (to thicken liquids). 1 Can 0  . HYDROcodone-acetaminophen (NORCO/VICODIN) 5-325 MG per tablet Take 1 tablet by mouth every 6 (six) hours as needed for moderate pain or severe pain. 6 tablet 0  . levothyroxine (SYNTHROID, LEVOTHROID) 75 MCG tablet Take 75 mcg by mouth daily before breakfast.    . memantine (NAMENDA) 10 MG tablet Take 10 mg by mouth 2 (two) times daily.    . mirabegron ER (MYRBETRIQ) 25 MG TB24 Take 25 mg by mouth daily.    . mirtazapine (REMERON SOL-TAB) 30 MG disintegrating tablet Take 30 mg by mouth daily.     No current facility-administered medications for this visit.    Allergies:   Aricept   Social History:  The patient  reports that she has quit smoking. She has never  used smokeless tobacco. She reports that she does not drink alcohol or use illicit drugs.   Family History:  The patient's family history includes Breast cancer in her mother; Stroke in her father.    ROS:  Please see the history of present illness.      PHYSICAL EXAM: VS:  BP 96/52 mmHg  Pulse 98  Ht  (1.676 m)  Wt 152 lb 3.2 oz (69.037 kg)  BMI 24.58 kg/m2 , BMI Body mass index is 24.58 kg/(m^2). GEN: Well nourished, well developed, in no acute distress Non verbal Pocket without hematoma  EKG:  EKG is not ordered today.    Recent Labs: 03/16/2014: Magnesium 2.1; TSH 2.240 05/29/2014: ALT 16; BUN 16; Creatinine 0.80; Hemoglobin 13.9; Platelets 236; Potassium 4.4; Sodium 140    Lipid Panel  No results found for: CHOL, TRIG, HDL, CHOLHDL, VLDL, LDLCALC, LDLDIRECT   Wt Readings from Last 3 Encounters:  07/03/14 152 lb 3.2 oz (69.037 kg)  03/15/14 160 lb (72.576 kg)  03/20/13 162 lb (73.483 kg)      Other studies Reviewed:    ASSESSMENT AND PLAN:  syncope   Complete heart block ? Related to donepazil   no intercurrent heart block is identified   Current medicines are reviewed at length with the patient today.   The patient does  not have concerns regarding her medicines.  The following changes were made today:  none  Labs/ tests ordered today include:     device interrogation  Disposition:   FU with me  1 year(s)   Signed, Sherryl MangesSteven Dejuana Weist, MD  07/03/2014 3:35 PM     [facility] [address] [place] [address] [phone] (office) [phone] (fax)

## 2014-07-06 ENCOUNTER — Encounter: Payer: Self-pay | Admitting: Internal Medicine

## 2014-07-11 ENCOUNTER — Encounter: Payer: Self-pay | Admitting: Internal Medicine

## 2014-07-17 ENCOUNTER — Ambulatory Visit (INDEPENDENT_AMBULATORY_CARE_PROVIDER_SITE_OTHER): Payer: Medicare Other | Admitting: *Deleted

## 2014-07-17 DIAGNOSIS — R55 Syncope and collapse: Secondary | ICD-10-CM | POA: Diagnosis not present

## 2014-07-19 NOTE — Progress Notes (Signed)
Loop recorder 

## 2014-07-23 ENCOUNTER — Encounter: Payer: Self-pay | Admitting: Internal Medicine

## 2014-07-27 ENCOUNTER — Other Ambulatory Visit: Payer: Self-pay | Admitting: Neurosurgery

## 2014-07-27 DIAGNOSIS — S12121D Other nondisplaced dens fracture, subsequent encounter for fracture with routine healing: Secondary | ICD-10-CM

## 2014-08-13 LAB — MDC_IDC_ENUM_SESS_TYPE_REMOTE
MDC IDC SESS DTM: 20160419050553
MDC IDC SET ZONE DETECTION INTERVAL: 3000 ms
Zone Setting Detection Interval: 2000 ms
Zone Setting Detection Interval: 370 ms

## 2014-08-16 ENCOUNTER — Ambulatory Visit (INDEPENDENT_AMBULATORY_CARE_PROVIDER_SITE_OTHER): Payer: Medicare Other | Admitting: *Deleted

## 2014-08-16 DIAGNOSIS — R55 Syncope and collapse: Secondary | ICD-10-CM | POA: Diagnosis not present

## 2014-08-21 NOTE — Progress Notes (Signed)
Loop recorder 

## 2014-09-06 LAB — CUP PACEART REMOTE DEVICE CHECK: Date Time Interrogation Session: 20160519110359

## 2014-09-11 ENCOUNTER — Encounter (HOSPITAL_COMMUNITY): Payer: Self-pay | Admitting: Cardiology

## 2014-09-11 ENCOUNTER — Emergency Department (HOSPITAL_COMMUNITY)
Admission: EM | Admit: 2014-09-11 | Discharge: 2014-09-11 | Disposition: A | Discharge status: Home / Self Care | Payer: Medicare Other | Attending: Emergency Medicine | Admitting: Emergency Medicine

## 2014-09-11 ENCOUNTER — Emergency Department (HOSPITAL_BASED_OUTPATIENT_CLINIC_OR_DEPARTMENT_OTHER): Payer: Medicare Other

## 2014-09-11 DIAGNOSIS — E039 Hypothyroidism, unspecified: Secondary | ICD-10-CM | POA: Diagnosis not present

## 2014-09-11 DIAGNOSIS — I808 Phlebitis and thrombophlebitis of other sites: Secondary | ICD-10-CM | POA: Insufficient documentation

## 2014-09-11 DIAGNOSIS — Z79899 Other long term (current) drug therapy: Secondary | ICD-10-CM | POA: Insufficient documentation

## 2014-09-11 DIAGNOSIS — E780 Pure hypercholesterolemia: Secondary | ICD-10-CM | POA: Insufficient documentation

## 2014-09-11 DIAGNOSIS — Z87891 Personal history of nicotine dependence: Secondary | ICD-10-CM | POA: Diagnosis not present

## 2014-09-11 DIAGNOSIS — M79602 Pain in left arm: Secondary | ICD-10-CM | POA: Diagnosis present

## 2014-09-11 DIAGNOSIS — F0390 Unspecified dementia without behavioral disturbance: Secondary | ICD-10-CM | POA: Diagnosis not present

## 2014-09-11 DIAGNOSIS — I809 Phlebitis and thrombophlebitis of unspecified site: Secondary | ICD-10-CM

## 2014-09-11 DIAGNOSIS — M7989 Other specified soft tissue disorders: Secondary | ICD-10-CM

## 2014-09-11 NOTE — ED Provider Notes (Signed)
CSN: [id number]     Arrival date & time 09/11/14  1551 History  This chart was scribed for Arthor CaptainAbigail Roselina Burgueno, working with Purvis SheffieldForrest Harrison, MD by Placido SouLogan Joldersma, ED Scribe. This patient was seen in room TR09C/TR09C and the patient's care was started at 4:23 PM.    Chief Complaint  Patient presents with  . Arm Pain  . Bleeding/Bruising   HPI Comments: Pt brought in by her daughter  The history is provided by a relative. No language interpreter was used.   HPI Comments: Brandi Schneider is a 69 y.o. non-verbal female with dementia who is accompanied by her daughter.  She presents to the Emergency Department by her daughter complaining of painful bruising and swelling to the medial area of her left upper arm. The daughter gives her mother Tylenol regularly for pain. Daughter denies use of anti-coagulants.  She denies injury.    Past Medical History  Diagnosis Date  . Dementia   . Hypothyroid   . Hypercholesteremia    Past Surgical History  Procedure Laterality Date  . Hysterotomy    . Loop recorder implant N/A 03/19/2014    Procedure: LOOP RECORDER IMPLANT;  Surgeon: Duke SalviaSteven C Klein, MD;  [facility];  Service: Cardiovascular;  Laterality: N/A;   Family History  Problem Relation Age of Onset  . Breast cancer Mother   . Stroke Father    History  Substance Use Topics  . Smoking status: Former Games developermoker  . Smokeless tobacco: Never Used  . Alcohol Use: No   OB History    No data available     Review of Systems A complete 10 system review of systems was obtained and all systems are negative except as noted in the HPI and PMH.   Allergies  Aricept  Home Medications   Prior to Admission medications   Medication Sig Start Date End Date Taking? Authorizing Provider  acetaminophen (TYLENOL) 500 MG tablet Take 500 mg by mouth every 6 (six) hours as needed for mild pain.   Yes Historical Provider, MD  cholecalciferol (VITAMIN D) 1000 UNITS tablet Take 1,000 Units by mouth  daily.   Yes Historical Provider, MD  food thickener (THICK IT) POWD Take 1 Container by mouth as needed (to thicken liquids). 03/20/14  Yes Jeralyn BennettEzequiel Zamora, MD  HYDROcodone-acetaminophen (NORCO/VICODIN) 5-325 MG per tablet Take 1 tablet by mouth every 6 (six) hours as needed for moderate pain or severe pain. 04/17/14  Yes Harle BattiestElizabeth Tysinger, NP  levothyroxine (SYNTHROID, LEVOTHROID) 75 MCG tablet Take 75 mcg by mouth daily before breakfast.   Yes Historical Provider, MD  memantine (NAMENDA) 10 MG tablet Take 10 mg by mouth 2 (two) times daily.   Yes Historical Provider, MD  mirabegron ER (MYRBETRIQ) 25 MG TB24 Take 25 mg by mouth daily.   Yes Historical Provider, MD  mirtazapine (REMERON SOL-TAB) 30 MG disintegrating tablet Take 30 mg by mouth daily. 05/22/14 05/22/15 Yes Historical Provider, MD   BP 109/76 mmHg  Pulse 98  Temp(Src) 98 F (36.7 C) (Oral)  Resp 20  SpO2 95% Physical Exam  Constitutional: She is oriented to person, place, and time. She appears well-developed and well-nourished. No distress.  HENT:  Head: Normocephalic and atraumatic.  Eyes: Conjunctivae and EOM are normal.  Neck: Neck supple.  Cardiovascular: Normal rate.   Pulmonary/Chest: Breath sounds normal. No respiratory distress.  Musculoskeletal:  Area of swelling, warmth, and redness on medial aspect of left elbow which extends proximally; new telangiectasia and venus distension; peripheral  pulse is intact  Neurological: She is alert and oriented to person, place, and time.  Skin: Skin is warm.  Psychiatric: Her behavior is normal.  Nursing note and vitals reviewed.   ED Course  Procedures  DIAGNOSTIC STUDIES: Oxygen Saturation is 95% on RA, adequate by my interpretation.    COORDINATION OF CARE: 4:25 PM Discussed treatment plan with pt at bedside which includes doppler study.  Daughter agreed to plan.  Labs Review Labs Reviewed - No data to display  Imaging Review No results found.   EKG  Interpretation None      MDM   Final diagnoses:  Superficial thrombophlebitis    Negative vascular duplex. + Superficial thrombophlebitis. Signs of infection. Treat with NSAIDs, warm compresses. She appears safe for discharge at this time.  I personally performed the services described in this documentation, which was scribed in my presence. The recorded information has been reviewed and is accurate.      Arthor Captain, PA-C 09/18/14 1956  Purvis Sheffield, MD 09/19/14 1538

## 2014-09-11 NOTE — Progress Notes (Signed)
VASCULAR LAB PRELIMINARY  PRELIMINARY  PRELIMINARY  PRELIMINARY  Left upper extremity venous duplex completed.    Preliminary report:  No evidence of left upper extremity deep vein thrombosis. There is superficial thrombosis of the basilic vein coursing from the antecubital fossa to the confluence with the brachial vein and proximal cephalic vein.  Brandi Schneider, RVS 09/11/2014, 8:25 PM

## 2014-09-11 NOTE — ED Notes (Signed)
Pt family noticed a bruise and swelling to the inside of the left arm. Swelling noted at triage. Area is soft to touch. Skin warm and dry.

## 2014-09-11 NOTE — ED Notes (Signed)
Called vascular to check on status of scan.

## 2014-09-11 NOTE — Discharge Instructions (Signed)
Phlebitis °Phlebitis is soreness and swelling (inflammation) of a vein. This can occur in your arms, legs, or torso (trunk), as well as deeper inside your body. Phlebitis is usually not serious when it occurs close to the surface of the body. However, it can cause serious problems when it occurs in a vein deeper inside the body. °CAUSES  °Phlebitis can be triggered by various things, including:  °· Reduced blood flow through your veins. This can happen with: °¨ Bed rest over a long period. °¨ Long-distance travel. °¨ Injury. °¨ Surgery. °¨ Being overweight (obese) or pregnant. °· Having an IV tube put in the vein and getting certain medicines through the vein. °· Cancer and cancer treatment. °· Use of illegal drugs taken through the vein. °· Inflammatory diseases. °· Inherited (genetic) diseases that increase the risk of blood clots. °· Hormone therapy, such as birth control pills. °SIGNS AND SYMPTOMS  °· Red, tender, swollen, and painful area on your skin. Usually, the area will be long and narrow. °· Firmness along the center of the affected area. This can indicate that a blood clot has formed. °· Low-grade fever. °DIAGNOSIS  °A health care provider can usually diagnose phlebitis by examining the affected area and asking about your symptoms. To check for infection or blood clots, your health care provider may order blood tests or an ultrasound exam of the area. Blood tests and your family history may also indicate if you have an underlying genetic disease that causes blood clots. Occasionally, a piece of tissue is taken from the body (biopsy sample) if an unusual cause of phlebitis is suspected. °TREATMENT  °Treatment will vary depending on the severity of the condition and the area of the body affected. Treatment may include: °· Use of a warm compress or heating pad. °· Use of compression stockings or bandages. °· Anti-inflammatory medicines. °· Removal of any IV tube that may be causing the problem. °· Medicines  that kill germs (antibiotics) if an infection is present. °· Blood-thinning medicines if a blood clot is suspected or present. °· In rare cases, surgery may be needed to remove damaged sections of vein. °HOME CARE INSTRUCTIONS  °· Only take over-the-counter or prescription medicines as directed by your health care provider. Take all medicines exactly as prescribed. °· Raise (elevate) the affected area above the level of your heart as directed by your health care provider. °· Apply a warm compress or heating pad to the affected area as directed by your health care provider. Do not sleep with the heating pad. °· Use compression stockings or bandages as directed. These will speed healing and prevent the condition from coming back. °· If you are on blood thinners: °¨ Get follow-up blood tests as directed by your health care provider. °¨ Check with your health care provider before using any new medicines. °¨ Carry a medical alert card or wear your medical alert jewelry to show that you are on blood thinners. °· For phlebitis in the legs: °¨ Avoid prolonged standing or bed rest. °¨ Keep your legs moving. Raise your legs when sitting or lying. °· Do not smoke. °· Women, particularly those over the age of 35, should consider the risks and benefits of taking the contraceptive pill. This kind of hormone treatment can increase your risk for blood clots. °· Follow up with your health care provider as directed. °SEEK MEDICAL CARE IF:  °· You have unusual bruising or any bleeding problems. °· Your swelling or pain in the affected area   is not improving. °· You are on anti-inflammatory medicine, and you develop belly (abdominal) pain. °SEEK IMMEDIATE MEDICAL CARE IF:  °· You have a sudden onset of chest pain or difficulty breathing. °· You have a fever or persistent symptoms for more than 2-3 days. °· You have a fever and your symptoms suddenly get worse. °MAKE SURE YOU: °· Understand these instructions. °· Will watch your  condition. °· Will get help right away if you are not doing well or get worse. °Document Released: 03/31/2001 Document Revised: 01/25/2013 Document Reviewed: 12/12/2012 °[employer]® Patient Information ©2015 [employer]. This information is not intended to replace advice given to you by your health care provider. Make sure you discuss any questions you have with your health care provider. ° °

## 2014-09-12 ENCOUNTER — Encounter: Payer: Self-pay | Admitting: Internal Medicine

## 2014-09-14 ENCOUNTER — Ambulatory Visit (INDEPENDENT_AMBULATORY_CARE_PROVIDER_SITE_OTHER): Payer: Medicare Other | Admitting: *Deleted

## 2014-09-14 DIAGNOSIS — R55 Syncope and collapse: Secondary | ICD-10-CM | POA: Diagnosis not present

## 2014-09-18 ENCOUNTER — Ambulatory Visit
Admission: RE | Admit: 2014-09-18 | Discharge: 2014-09-18 | Disposition: A | Discharge status: Home / Self Care | Payer: Medicare Other | Source: Ambulatory Visit | Attending: Neurosurgery | Admitting: Neurosurgery

## 2014-09-18 DIAGNOSIS — S12121D Other nondisplaced dens fracture, subsequent encounter for fracture with routine healing: Secondary | ICD-10-CM

## 2014-09-25 LAB — CUP PACEART REMOTE DEVICE CHECK
MDC IDC SESS DTM: 20160522181131
MDC IDC SET ZONE DETECTION INTERVAL: 3000 ms
Zone Setting Detection Interval: 2000 ms
Zone Setting Detection Interval: 370 ms

## 2014-09-25 NOTE — Progress Notes (Signed)
Loop recorder 

## 2014-10-15 ENCOUNTER — Encounter: Payer: Self-pay | Admitting: Internal Medicine

## 2014-10-15 ENCOUNTER — Ambulatory Visit (INDEPENDENT_AMBULATORY_CARE_PROVIDER_SITE_OTHER): Payer: Medicare Other | Admitting: *Deleted

## 2014-10-15 ENCOUNTER — Other Ambulatory Visit: Payer: Self-pay

## 2014-10-15 DIAGNOSIS — R55 Syncope and collapse: Secondary | ICD-10-CM | POA: Diagnosis not present

## 2014-10-16 NOTE — Progress Notes (Signed)
Loop recorder 

## 2014-10-17 LAB — CUP PACEART REMOTE DEVICE CHECK
Date Time Interrogation Session: 20160619040522
MDC IDC SET ZONE DETECTION INTERVAL: 370 ms
Zone Setting Detection Interval: 2000 ms
Zone Setting Detection Interval: 3000 ms

## 2014-11-01 ENCOUNTER — Encounter: Payer: Self-pay | Admitting: Internal Medicine

## 2014-11-12 ENCOUNTER — Encounter: Payer: Self-pay | Admitting: Internal Medicine

## 2014-11-14 ENCOUNTER — Ambulatory Visit (INDEPENDENT_AMBULATORY_CARE_PROVIDER_SITE_OTHER): Payer: Medicare Other

## 2014-11-14 DIAGNOSIS — R55 Syncope and collapse: Secondary | ICD-10-CM | POA: Diagnosis not present

## 2014-11-21 NOTE — Progress Notes (Signed)
Loop recorder 

## 2014-11-30 LAB — CUP PACEART REMOTE DEVICE CHECK: Date Time Interrogation Session: 20160812151751

## 2014-12-14 ENCOUNTER — Ambulatory Visit (INDEPENDENT_AMBULATORY_CARE_PROVIDER_SITE_OTHER): Payer: Medicare Other | Admitting: *Deleted

## 2014-12-14 DIAGNOSIS — R55 Syncope and collapse: Secondary | ICD-10-CM | POA: Diagnosis not present

## 2014-12-19 NOTE — Progress Notes (Signed)
Loop recorder 

## 2014-12-21 ENCOUNTER — Emergency Department (HOSPITAL_COMMUNITY)
Admission: EM | Admit: 2014-12-21 | Discharge: 2014-12-22 | Disposition: A | Discharge status: Home / Self Care | Payer: Medicare Other | Attending: Emergency Medicine | Admitting: Emergency Medicine

## 2014-12-21 DIAGNOSIS — W01198A Fall on same level from slipping, tripping and stumbling with subsequent striking against other object, initial encounter: Secondary | ICD-10-CM | POA: Insufficient documentation

## 2014-12-21 DIAGNOSIS — S139XXA Sprain of joints and ligaments of unspecified parts of neck, initial encounter: Secondary | ICD-10-CM

## 2014-12-21 DIAGNOSIS — S134XXA Sprain of ligaments of cervical spine, initial encounter: Secondary | ICD-10-CM | POA: Insufficient documentation

## 2014-12-21 DIAGNOSIS — W19XXXA Unspecified fall, initial encounter: Secondary | ICD-10-CM

## 2014-12-21 DIAGNOSIS — S39012A Strain of muscle, fascia and tendon of lower back, initial encounter: Secondary | ICD-10-CM | POA: Diagnosis not present

## 2014-12-21 DIAGNOSIS — F0390 Unspecified dementia without behavioral disturbance: Secondary | ICD-10-CM | POA: Diagnosis not present

## 2014-12-21 DIAGNOSIS — E039 Hypothyroidism, unspecified: Secondary | ICD-10-CM | POA: Diagnosis not present

## 2014-12-21 DIAGNOSIS — Y998 Other external cause status: Secondary | ICD-10-CM | POA: Insufficient documentation

## 2014-12-21 DIAGNOSIS — R52 Pain, unspecified: Secondary | ICD-10-CM

## 2014-12-21 DIAGNOSIS — Y9389 Activity, other specified: Secondary | ICD-10-CM | POA: Insufficient documentation

## 2014-12-21 DIAGNOSIS — Z79899 Other long term (current) drug therapy: Secondary | ICD-10-CM | POA: Diagnosis not present

## 2014-12-21 DIAGNOSIS — S3992XA Unspecified injury of lower back, initial encounter: Secondary | ICD-10-CM | POA: Diagnosis present

## 2014-12-21 DIAGNOSIS — Z87891 Personal history of nicotine dependence: Secondary | ICD-10-CM | POA: Insufficient documentation

## 2014-12-21 DIAGNOSIS — Y92002 Bathroom of unspecified non-institutional (private) residence single-family (private) house as the place of occurrence of the external cause: Secondary | ICD-10-CM | POA: Diagnosis not present

## 2014-12-21 DIAGNOSIS — E780 Pure hypercholesterolemia: Secondary | ICD-10-CM | POA: Insufficient documentation

## 2014-12-21 NOTE — ED Notes (Addendum)
Pt arrives from home via [facility] c/o fall with L sided pain.  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  Pt;'s daughter reports pt lost balance in bathroom and fell, hitting L side of body on tub.  Pt;s daughter reports pt having diarrhea today.  Pt;s daughter denies LOC, N/V.  Pt lives with daughter.  C-collar, LSB in place.  Pt satting at 88% on RA, grunting. Marland Kitchen  Pt placed on 2L O2.  No obvious deformities noted

## 2014-12-22 ENCOUNTER — Emergency Department (HOSPITAL_COMMUNITY): Payer: Medicare Other

## 2014-12-22 ENCOUNTER — Encounter (HOSPITAL_COMMUNITY): Payer: Self-pay | Admitting: Emergency Medicine

## 2014-12-22 DIAGNOSIS — S39012A Strain of muscle, fascia and tendon of lower back, initial encounter: Secondary | ICD-10-CM | POA: Diagnosis not present

## 2014-12-22 NOTE — ED Provider Notes (Signed)
CSN: [id number]     Arrival date & time 12/21/14  2340 History  This chart was scribed for Zadie Rhine, MD by Leone Payor, ED Scribe. This patient was seen in room B16C/B16C and the patient's care was started 12:00 AM.    Chief Complaint  Patient presents with  . Fall    The history is provided by a caregiver. The history is limited by the condition of the patient. No language interpreter was used.   LEVEL 5 CAVEAT DUE TO DEMENTIA  HPI Comments: Brandi Schneider is a nonverbal 69 y.o. female with past medical history of dementia who presents to the Emergency Department complaining of a fall that occurred PTA. Daughter is her primary caregiver and states patient had a fall in the bathroom tonight. She states patient lost her balance and fell backwards, striking her back and shoulders on the bathtub. Daughter states she cradled her head so she did not have a head injury or LOC. Daughter states patient had diarrhea today but this is not unusual. Patient does not take any anti-coagulants. Daughter reports patient had a fall down stairs previously which resulted in Thoracic compression fractures and Cervical fractures. Daughter denies cough, SOB, fever.    Past Medical History  Diagnosis Date  . Dementia   . Hypothyroid   . Hypercholesteremia    Past Surgical History  Procedure Laterality Date  . Hysterotomy    . Loop recorder implant N/A 03/19/2014    Procedure: LOOP RECORDER IMPLANT;  Surgeon: Duke Salvia, MD;  Location: [facility] CATH LAB;  Service: Cardiovascular;  Laterality: N/A;   Family History  Problem Relation Age of Onset  . Breast cancer Mother   . Stroke Father    Social History  Substance Use Topics  . Smoking status: Former Games developer  . Smokeless tobacco: Never Used  . Alcohol Use: No   OB History    No data available     Review of Systems  Unable to perform ROS: Dementia      Allergies  Aricept  Home Medications   Prior to Admission medications   Medication  Sig Start Date End Date Taking? Authorizing Provider  acetaminophen (TYLENOL) 500 MG tablet Take 500 mg by mouth every 6 (six) hours as needed for mild pain.    Historical Provider, MD  cholecalciferol (VITAMIN D) 1000 UNITS tablet Take 1,000 Units by mouth daily.    Historical Provider, MD  food thickener (THICK IT) POWD Take 1 Container by mouth as needed (to thicken liquids). 03/20/14   Jeralyn Bennett, MD  HYDROcodone-acetaminophen (NORCO/VICODIN) 5-325 MG per tablet Take 1 tablet by mouth every 6 (six) hours as needed for moderate pain or severe pain. 04/17/14   Harle Battiest, NP  levothyroxine (SYNTHROID, LEVOTHROID) 75 MCG tablet Take 75 mcg by mouth daily before breakfast.    Historical Provider, MD  memantine (NAMENDA) 10 MG tablet Take 10 mg by mouth 2 (two) times daily.    Historical Provider, MD  mirabegron ER (MYRBETRIQ) 25 MG TB24 Take 25 mg by mouth daily.    Historical Provider, MD  mirtazapine (REMERON SOL-TAB) 30 MG disintegrating tablet Take 30 mg by mouth daily. 05/22/14 05/22/15  Historical Provider, MD   BP 115/96 mmHg  Pulse 85  Temp(Src) 98.1 F (36.7 C) (Axillary)  Resp 21  Ht 5\' 6"  (1.676 m)  Wt 140 lb (63.504 kg)  BMI 22.61 kg/m2  SpO2 88% Physical Exam  Nursing note and vitals reviewed.   CONSTITUTIONAL: Elderly and frail  HEAD: Normocephalic/atraumatic EYES: EOMI/PERRL ENMT: Mucous membranes moist NECK: supple no meningeal signs SPINE/BACK: No bruising/crepitance/stepoffs noted to spine. Patient maintained in spinal precautions/logroll utilized  CV: S1/S2 noted, no murmurs/rubs/gallops noted LUNGS: Lungs are clear to auscultation bilaterally, no apparent distress ABDOMEN: soft, nontender, no rebound or guarding, bowel sounds noted throughout abdomen GU:no cva tenderness NEURO: Pt is awake/alert. Patient nonverbal at baseline.    EXTREMITIES: pulses normal/equal, full ROM. Chronically contracted at baseline of upper extremities. All other  extremities/joints palpated/ranged and nontender  SKIN: warm, color normal, scattered abrasions to back.  PSYCH: unable to assess.  ED Course  Procedures   DIAGNOSTIC STUDIES: Oxygen Saturation is 88% on RA, low by my interpretation.    COORDINATION OF CARE: 12:09 AM Discussed treatment plan with pt at bedside and pt agreed to plan.  pt with did have drop in O2 sat but daughter reports this is not new and she has poor respiratory effort at baseline.  No recent cough/fever or SOB noted per daughter Imaging pending 1:42 AM No new injury by imaging Pt can ambulate without difficulty in the ED She is at baseline per daughter Hypoxia improved No recent fever/cough to suggest acute pneumonia Daughter reports O2 sat usually runs in low 90s at baseline Stable for d/c home  Imaging Review Dg Chest 1 View  12/22/2014   CLINICAL DATA:  Left-sided pain after falling today  EXAM: CHEST  1 VIEW  COMPARISON:  02/2025/15  FINDINGS: There is unchanged mild right hemidiaphragm elevation. There is generalized interstitial coarsening which may chronic. There probably is a small left pleural effusion. There is mild left base alveolar opacity which may be due to atelectasis, infectious infiltrate, contusion.  IMPRESSION: Probable small effusion and left base atelectasis or contusion. Cannot exclude infectious infiltrate. This is superimposed on chronic appearing interstitial coarsening.   Electronically Signed   By: Ellery Plunk M.D.   On: 12/22/2014 01:00   Dg Thoracic Spine W/swimmers  12/22/2014   CLINICAL DATA:  Status post fall, with left-sided upper back pain. Initial encounter.  EXAM: THORACIC SPINE - 3 VIEWS  COMPARISON:  Thoracic spine radiographs performed 04/17/2014  FINDINGS: There is no evidence of acute fracture or subluxation. Mild chronic compression deformities of T3 and T5 are stable in appearance. Vertebral bodies demonstrate normal alignment. Intervertebral disc spaces are preserved.   Vascular congestion is noted, with chronically increased interstitial markings. The mediastinum is unremarkable in appearance. A loop recorder is noted.  IMPRESSION: No evidence of acute fracture or subluxation along the thoracic spine. Mild chronic compression deformities of T3 and T5 are stable in appearance.   Electronically Signed   By: Roanna Raider M.D.   On: 12/22/2014 01:00   Dg Lumbar Spine Complete  12/22/2014   CLINICAL DATA:  Status post fall, with left-sided lower back pain. Initial encounter.  EXAM: LUMBAR SPINE - COMPLETE 4+ VIEW  COMPARISON:  Lumbar spine radiographs performed 04/17/2014  FINDINGS: There is no evidence of fracture or subluxation. Vertebral bodies demonstrate normal height and alignment. Intervertebral disc spaces are preserved. The visualized neural foramina are grossly unremarkable in appearance.  The visualized bowel gas pattern is unremarkable in appearance; air and stool are noted within the colon. The sacroiliac joints are within normal limits.  IMPRESSION: No evidence of fracture or subluxation along the lumbar spine.   Electronically Signed   By: Roanna Raider M.D.   On: 12/22/2014 01:02   Ct Cervical Spine Wo Contrast  12/22/2014   CLINICAL DATA:  Status post fall backwards in bathroom. Concern for cervical spine injury. Initial encounter.  EXAM: CT CERVICAL SPINE WITHOUT CONTRAST  TECHNIQUE: Multidetector CT imaging of the cervical spine was performed without intravenous contrast. Multiplanar CT image reconstructions were also generated.  COMPARISON:  CT of the cervical spine performed 09/18/2014  FINDINGS: There is no evidence of acute fracture or subluxation. There is a healed fracture of the base of the dens, with chronic anterior displacement of the dens fragment, measuring approximately 7 mm. This is unchanged from the prior study. Multilevel disc space narrowing is noted along the cervical spine, with scattered anterior and posterior disc osteophyte complexes.  Prevertebral soft tissues are within normal limits.  The thyroid gland is unremarkable in appearance. The visualized lung apices are clear. No significant soft tissue abnormalities are seen.  IMPRESSION: 1. No evidence of acute fracture or subluxation along the cervical spine. 2. Healed base of dens fracture again noted, with chronic anterior displacement measuring 7 mm, unchanged from the prior study. Mild degenerative change along the cervical spine.   Electronically Signed   By: Roanna Raider M.D.   On: 12/22/2014 00:57   I have personally reviewed and evaluated these images as part of my medical decision-making.   EKG Interpretation   Date/Time:  Friday December 21 2014 23:55:51 EDT Ventricular Rate:  84 PR Interval:  147 QRS Duration: 132 QT Interval:  421 QTC Calculation: 498 R Axis:   72 Text Interpretation:  Sinus rhythm Right bundle branch block artifact  noted No significant change since last tracing Confirmed by Bebe Shaggy  MD,  Leanny Moeckel ([id number]) on 12/21/2014 11:59:47 PM      MDM   Final diagnoses:  Fall, initial encounter  Acute cervical sprain, initial encounter  Back strain, initial encounter    Nursing notes including past medical history and social history reviewed and considered in documentation xrays/imaging reviewed by myself and considered during evaluation    I personally performed the services described in this documentation, which was scribed in my presence. The recorded information has been reviewed and is accurate.      Zadie Rhine, MD 12/22/14 [phone]

## 2014-12-25 ENCOUNTER — Encounter (HOSPITAL_COMMUNITY): Payer: Self-pay | Admitting: *Deleted

## 2014-12-25 ENCOUNTER — Emergency Department (HOSPITAL_COMMUNITY)
Admission: EM | Admit: 2014-12-25 | Discharge: 2014-12-25 | Disposition: A | Discharge status: Home / Self Care | Payer: Medicare Other | Attending: Emergency Medicine | Admitting: Emergency Medicine

## 2014-12-25 ENCOUNTER — Emergency Department (HOSPITAL_COMMUNITY): Payer: Medicare Other

## 2014-12-25 DIAGNOSIS — E782 Mixed hyperlipidemia: Secondary | ICD-10-CM | POA: Diagnosis not present

## 2014-12-25 DIAGNOSIS — Z79899 Other long term (current) drug therapy: Secondary | ICD-10-CM | POA: Insufficient documentation

## 2014-12-25 DIAGNOSIS — Z87891 Personal history of nicotine dependence: Secondary | ICD-10-CM | POA: Insufficient documentation

## 2014-12-25 DIAGNOSIS — R0602 Shortness of breath: Secondary | ICD-10-CM | POA: Diagnosis present

## 2014-12-25 DIAGNOSIS — F0390 Unspecified dementia without behavioral disturbance: Secondary | ICD-10-CM | POA: Insufficient documentation

## 2014-12-25 DIAGNOSIS — E039 Hypothyroidism, unspecified: Secondary | ICD-10-CM | POA: Insufficient documentation

## 2014-12-25 DIAGNOSIS — R059 Cough, unspecified: Secondary | ICD-10-CM

## 2014-12-25 DIAGNOSIS — R05 Cough: Secondary | ICD-10-CM | POA: Diagnosis not present

## 2014-12-25 LAB — BASIC METABOLIC PANEL
Anion gap: 8 (ref 5–15)
BUN: 17 mg/dL (ref 6–20)
CALCIUM: 9.7 mg/dL (ref 8.9–10.3)
CO2: 27 mmol/L (ref 22–32)
Chloride: 104 mmol/L (ref 101–111)
Creatinine, Ser: 0.57 mg/dL (ref 0.44–1.00)
GFR calc Af Amer: >60 mL/min (ref >60)
GLUCOSE: 98 mg/dL (ref 65–99)
Potassium: 4.3 mmol/L (ref 3.5–5.1)
SODIUM: 139 mmol/L (ref 135–145)

## 2014-12-25 LAB — CBC WITH DIFFERENTIAL/PLATELET
BASOS ABS: 0 10*3/uL (ref 0.0–0.1)
Basophils Relative: 1 % (ref 0–1)
EOS ABS: 0.1 10*3/uL (ref 0.0–0.7)
EOS PCT: 2 % (ref 0–5)
HCT: 42.6 % (ref 36.0–46.0)
Hemoglobin: 13.8 g/dL (ref 12.0–15.0)
LYMPHS PCT: 24 % (ref 12–46)
Lymphs Abs: 1.1 10*3/uL (ref 0.7–4.0)
MCH: 29.6 pg (ref 26.0–34.0)
MCHC: 32.4 g/dL (ref 30.0–36.0)
MCV: 91.4 fL (ref 78.0–100.0)
MONO ABS: 0.4 10*3/uL (ref 0.1–1.0)
Monocytes Relative: 8 % (ref 3–12)
Neutro Abs: 3.1 10*3/uL (ref 1.7–7.7)
Neutrophils Relative %: 65 % (ref 43–77)
PLATELETS: 190 10*3/uL (ref 150–400)
RBC: 4.66 MIL/uL (ref 3.87–5.11)
RDW: 12.9 % (ref 11.5–15.5)
WBC: 4.6 10*3/uL (ref 4.0–10.5)

## 2014-12-25 NOTE — ED Notes (Signed)
Pt. Ambulated with pulse ox. O2 while walking ranged from 95%-100%. Pt was in no respiratory distress while ambulating and had no difficulty with ambulating. Tried to offer patient fluids but patient is on a thickened liquid diet and we are not able to thicken her fluids.

## 2014-12-25 NOTE — ED Provider Notes (Signed)
CSN: [id number]     Arrival date & time 12/25/14  1621 History   First MD Initiated Contact with Patient 12/25/14 1659     Chief Complaint  Patient presents with  . Shortness of Breath     (Consider location/radiation/quality/duration/timing/severity/associated sxs/prior Treatment) HPI  History obtained by patient's daughter who is her caregiver as patient is non-verbal.   69 year old female who presents with concern for. History of frontotemporal dementia and non-verbal at baseline. Reports history of dysphagia on a dysphagia 3 diet with thickened liquids. OVer the past week, she has been coughing more with eating. Recently sounding coarse and coughing more. Denies difficulty breathing or fever. Denies lower extremity edema or orthopnea. Denies pain or chest pain.   Past Medical History  Diagnosis Date  . Dementia   . Hypothyroid   . Hypercholesteremia    Past Surgical History  Procedure Laterality Date  . Hysterotomy    . Loop recorder implant N/A 03/19/2014    Procedure: LOOP RECORDER IMPLANT;  Surgeon: Duke Salvia, MD;  Location: [facility] CATH LAB;  Service: Cardiovascular;  Laterality: N/A;   Family History  Problem Relation Age of Onset  . Breast cancer Mother   . Stroke Father    Social History  Substance Use Topics  . Smoking status: Former Games developer  . Smokeless tobacco: Never Used  . Alcohol Use: No   OB History    No data available     Review of Systems 10/14 systems reviewed and are negative other than those stated in the HPI   Allergies  Aricept  Home Medications   Prior to Admission medications   Medication Sig Start Date End Date Taking? Authorizing Provider  acetaminophen (TYLENOL) 500 MG tablet Take 500 mg by mouth every 6 (six) hours as needed for mild pain.   Yes Historical Provider, MD  cholecalciferol (VITAMIN D) 1000 UNITS tablet Take 1,000 Units by mouth daily.   Yes Historical Provider, MD  food thickener (THICK IT) POWD Take 1 Container by  mouth as needed (to thicken liquids). 03/20/14  Yes Jeralyn Bennett, MD  HYDROcodone-acetaminophen (NORCO/VICODIN) 5-325 MG per tablet Take 1 tablet by mouth every 6 (six) hours as needed for moderate pain or severe pain. 04/17/14  Yes Harle Battiest, NP  levothyroxine (SYNTHROID, LEVOTHROID) 75 MCG tablet Take 75 mcg by mouth daily before breakfast.   Yes Historical Provider, MD  mirabegron ER (MYRBETRIQ) 25 MG TB24 Take 25 mg by mouth daily.   Yes Historical Provider, MD  mirtazapine (REMERON SOL-TAB) 30 MG disintegrating tablet Take 30 mg by mouth daily. 05/22/14 05/22/15 Yes Historical Provider, MD  memantine (NAMENDA) 10 MG tablet Take 10 mg by mouth 2 (two) times daily.    Historical Provider, MD   BP 124/87 mmHg  Pulse 89  Temp(Src)   Resp 20  SpO2 100% Physical Exam Physical Exam  Nursing note and vitals reviewed. Constitutional: Elderly woman, appearing well developed, well nourished, non-toxic, and in no acute distress Head: Normocephalic and atraumatic.  Mouth/Throat: Oropharynx is clear and moist.  Neck: Normal range of motion. Neck supple.  Cardiovascular: Normal rate and regular rhythm.   no lower extremity edema Pulmonary/Chest: Effort normal. Coarse crackles in the right lower lung field. No accessory muscle usage. Abdominal: Soft. There is no tenderness. There is no rebound and no guarding.  Musculoskeletal: Normal range of motion.  Neurological: Alert, obeys simple commands Skin: Skin is warm and dry.  Psychiatric: Cooperative  ED Course  Procedures (including critical  care time) Labs Review Labs Reviewed  CBC WITH DIFFERENTIAL/PLATELET  BASIC METABOLIC PANEL    Imaging Review Dg Chest 2 View  12/25/2014   CLINICAL DATA:  Possible aspiration, dysphagia with increasing cough  EXAM: CHEST - 2 VIEW  COMPARISON:  12/22/2014  FINDINGS: Cardiac shadow is stable. A loop recorder is again noted along the left anterior chest wall. Mild blunting of left costophrenic angle  is again seen. The lungs are otherwise clear. No acute bony abnormality is noted.  IMPRESSION: Stable changes in the left base.  No acute abnormality noted.   Electronically Signed   By: Alcide Clever M.D.   On: 12/25/2014 19:41   I have personally reviewed and evaluated these images and lab results as part of my medical decision-making.   EKG Interpretation   Date/Time:  Tuesday December 25 2014 16:47:28 EDT Ventricular Rate:  86 PR Interval:  136 QRS Duration: 116 QT Interval:  378 QTC Calculation: 452 R Axis:   50 Text Interpretation:  Normal sinus rhythm Incomplete right bundle branch  block Baseline artifact No significant change since last tracing Confirmed  by River Ambrosio MD, Annabelle Harman ([id number]) on 12/25/2014 5:05:19 PM      MDM   Final diagnoses:  Cough    69 year old female with history of frontal temporal dementia, dysphasia, and is nonverbal at baseline who presents with coughing and concern for aspiration. She is nontoxic in no acute distress on presentation. She is breathing comfortably on room air, with pulse ox in the low 90 percentile. Her daughter states that this is normal for her over the course of the past several months. She does have notable mild crackles in the right lower lung. She does not appear fluid overloaded. Chest x-ray shows no acute cardiopulmonary processes. No evidence of aspiration or pneumonia. She is able to ambulate in the emergency department with no significant increased work of breathing or hypoxia. No concern for acute infectious process. At this time she is felt appropriate for discharge home. She will have repeat speech evaluation set up through her PCP to discuss new diet. Strict return and follow-up instructions are reviewed with her daughter. She expressed understanding of all discharge instructions and felt comfortable to plan of care.   Lavera Guise, MD 12/26/14 [phone]

## 2014-12-25 NOTE — ED Notes (Signed)
Pt. Left with all belongings. Discharge instructions were reviewed and all questions were answered.  

## 2014-12-25 NOTE — Discharge Instructions (Signed)
Please return without fail for worsening symptoms including fever, worsening cough, difficulty breathing, confusion, or any other symptoms concerning to you.   Cough, Adult  A cough is a reflex that helps clear your throat and airways. It can help heal the body or may be a reaction to an irritated airway. A cough may only last 2 or 3 weeks (acute) or may last more than 8 weeks (chronic).  CAUSES Acute cough:  Viral or bacterial infections. Chronic cough:  Infections.  Allergies.  Asthma.  Post-nasal drip.  Smoking.  Heartburn or acid reflux.  Some medicines.  Chronic lung problems (COPD).  Cancer. SYMPTOMS   Cough.  Fever.  Chest pain.  Increased breathing rate.  High-pitched whistling sound when breathing (wheezing).  Colored mucus that you cough up (sputum). TREATMENT   A bacterial cough may be treated with antibiotic medicine.  A viral cough must run its course and will not respond to antibiotics.  Your caregiver may recommend other treatments if you have a chronic cough. HOME CARE INSTRUCTIONS   Only take over-the-counter or prescription medicines for pain, discomfort, or fever as directed by your caregiver. Use cough suppressants only as directed by your caregiver.  Use a cold steam vaporizer or humidifier in your bedroom or home to help loosen secretions.  Sleep in a semi-upright position if your cough is worse at night.  Rest as needed.  Stop smoking if you smoke. SEEK IMMEDIATE MEDICAL CARE IF:   You have pus in your sputum.  Your cough starts to worsen.  You cannot control your cough with suppressants and are losing sleep.  You begin coughing up blood.  You have difficulty breathing.  You develop pain which is getting worse or is uncontrolled with medicine.  You have a fever. MAKE SURE YOU:   Understand these instructions.  Will watch your condition.  Will get help right away if you are not doing well or get worse. Document  Released: 10/03/2010 Document Revised: 06/29/2011 Document Reviewed: 10/03/2010 [facility] Patient Information 2015 [place], [place]. This information is not intended to replace advice given to you by your health care provider. Make sure you discuss any questions you have with your health care provider.

## 2014-12-25 NOTE — ED Notes (Signed)
pts daughter reports that patient has been coughing when eating recently and has been choking on her food. pts daughter reports pt is on thickened diet. Daughter denies noticing any fever or chills recently.

## 2014-12-25 NOTE — ED Notes (Signed)
Pt has dementia and is non verbal.  Pt has been on thicken liquids and daughter feels like she has probably aspirated on applesauce, oatmeal and orange juice

## 2014-12-28 LAB — CUP PACEART REMOTE DEVICE CHECK: Date Time Interrogation Session: 20160909102430

## 2014-12-28 NOTE — Progress Notes (Signed)
Carelink summary report received. Battery status OK. Normal device function. No new symptom episodes, tachy episodes, brady, or pause episodes. No new AF episodes. Monthly summary reports and ROV with SK in 06/2015. 

## 2015-01-01 ENCOUNTER — Other Ambulatory Visit: Payer: Self-pay | Admitting: Nurse Practitioner

## 2015-01-01 DIAGNOSIS — T17308A Unspecified foreign body in larynx causing other injury, initial encounter: Secondary | ICD-10-CM

## 2015-01-03 ENCOUNTER — Ambulatory Visit
Admission: RE | Admit: 2015-01-03 | Discharge: 2015-01-03 | Disposition: A | Discharge status: Home / Self Care | Payer: Medicare Other | Source: Ambulatory Visit | Attending: Nurse Practitioner | Admitting: Nurse Practitioner

## 2015-01-03 DIAGNOSIS — T17308A Unspecified foreign body in larynx causing other injury, initial encounter: Secondary | ICD-10-CM

## 2015-01-09 ENCOUNTER — Other Ambulatory Visit (HOSPITAL_COMMUNITY): Payer: Self-pay | Admitting: Nurse Practitioner

## 2015-01-09 DIAGNOSIS — R1314 Dysphagia, pharyngoesophageal phase: Secondary | ICD-10-CM

## 2015-01-09 DIAGNOSIS — T17308A Unspecified foreign body in larynx causing other injury, initial encounter: Secondary | ICD-10-CM

## 2015-01-14 ENCOUNTER — Ambulatory Visit (INDEPENDENT_AMBULATORY_CARE_PROVIDER_SITE_OTHER): Payer: Medicare Other | Admitting: *Deleted

## 2015-01-14 DIAGNOSIS — R55 Syncope and collapse: Secondary | ICD-10-CM

## 2015-01-17 ENCOUNTER — Ambulatory Visit (HOSPITAL_COMMUNITY)
Admission: RE | Admit: 2015-01-17 | Discharge: 2015-01-17 | Disposition: A | Discharge status: Home / Self Care | Payer: Medicare Other | Source: Ambulatory Visit | Attending: Nurse Practitioner | Admitting: Nurse Practitioner

## 2015-01-17 DIAGNOSIS — R0989 Other specified symptoms and signs involving the circulatory and respiratory systems: Secondary | ICD-10-CM | POA: Insufficient documentation

## 2015-01-17 DIAGNOSIS — T17308A Unspecified foreign body in larynx causing other injury, initial encounter: Secondary | ICD-10-CM

## 2015-01-17 DIAGNOSIS — R1314 Dysphagia, pharyngoesophageal phase: Secondary | ICD-10-CM | POA: Insufficient documentation

## 2015-01-17 NOTE — Progress Notes (Signed)
Loop recorder 

## 2015-01-23 LAB — CUP PACEART REMOTE DEVICE CHECK: MDC IDC SESS DTM: 20160926000530

## 2015-01-23 NOTE — Progress Notes (Signed)
Carelink summary report received. Battery status OK. Normal device function. No new symptom episodes, tachy episodes, brady, or pause episodes. No new AF episodes. Monthly summary reports and ROV w/ SK 3/17.  

## 2015-01-29 ENCOUNTER — Encounter: Payer: Self-pay | Admitting: Internal Medicine

## 2015-02-05 ENCOUNTER — Encounter: Payer: Self-pay | Admitting: Internal Medicine

## 2015-02-12 ENCOUNTER — Ambulatory Visit (INDEPENDENT_AMBULATORY_CARE_PROVIDER_SITE_OTHER): Payer: Medicare Other | Admitting: *Deleted

## 2015-02-12 DIAGNOSIS — R55 Syncope and collapse: Secondary | ICD-10-CM

## 2015-02-14 NOTE — Progress Notes (Signed)
Loop recorder 

## 2015-03-01 ENCOUNTER — Encounter: Payer: Self-pay | Admitting: Internal Medicine

## 2015-03-05 ENCOUNTER — Telehealth: Payer: Self-pay | Admitting: Internal Medicine

## 2015-03-05 NOTE — Telephone Encounter (Signed)
ERROR

## 2015-03-15 LAB — CUP PACEART REMOTE DEVICE CHECK: Date Time Interrogation Session: 20161026000820

## 2015-03-15 NOTE — Progress Notes (Signed)
Carelink summary report received. Battery status OK. Normal device function. No new symptom episodes, tachy episodes, brady, or pause episodes. No new AF episodes. On 03/05/15, daughter called to cancel next remote appointment, states patient is deceased. Carelink discontinued.

## 2015-03-21 DEATH — deceased

## 2015-04-01 IMAGING — CT CT CERVICAL SPINE W/O CM
3 of 4 series · 11 of 33 positions shown, 13 images · non-contrast
Comparison: Cervical spine CT 03/15/2014

CLINICAL DATA: Fall with neck pain.  Nonverbal.  Initial encounter.

EXAM:
CT CERVICAL SPINE WITHOUT CONTRAST
TECHNIQUE: Multidetector CT imaging of the cervical spine was performed without
intravenous contrast. Multiplanar CT image reconstructions were also
generated.

[Series 6: coronals · coronal · 0.19mm/px · 3 of 45 slices shown]
[im 9/45  bone]
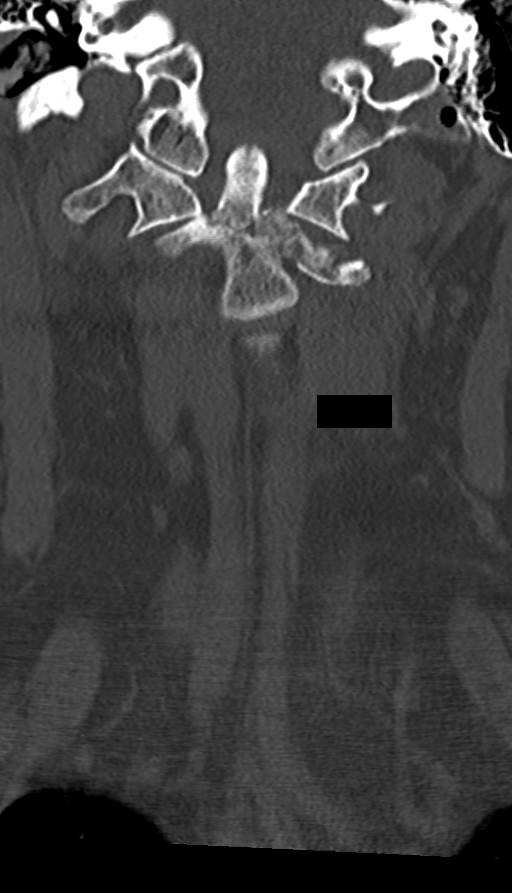
[im 18/45  bone]
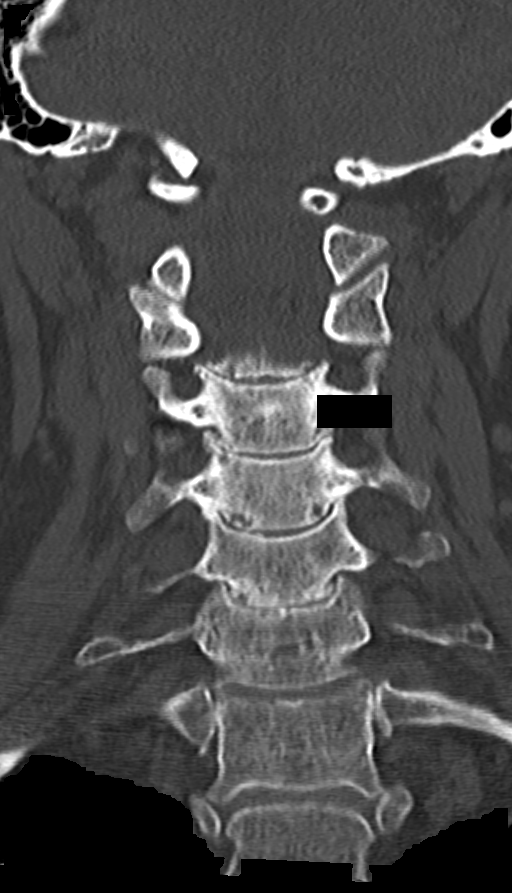
[im 27/45  bone]
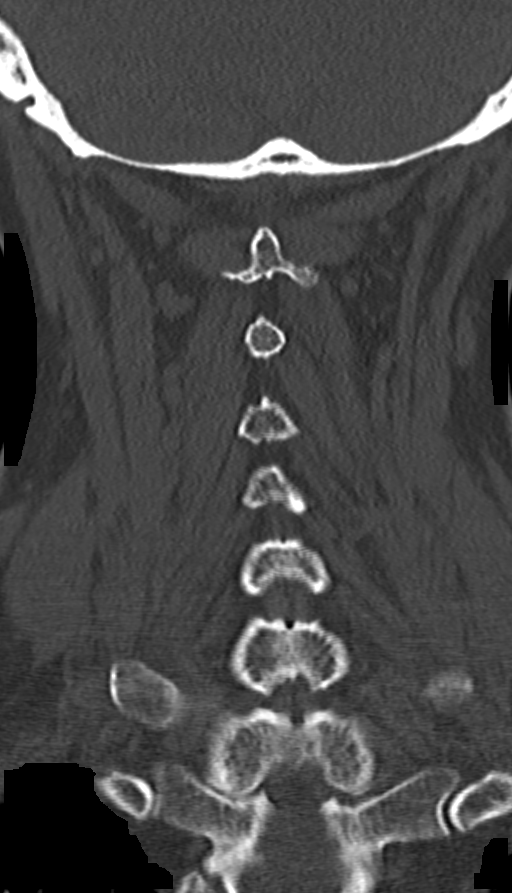

[Series 7: sagittals · sagittal · 0.22mm/px · 5 of 40 slices shown, 6 images]
[im 14/40  bone]
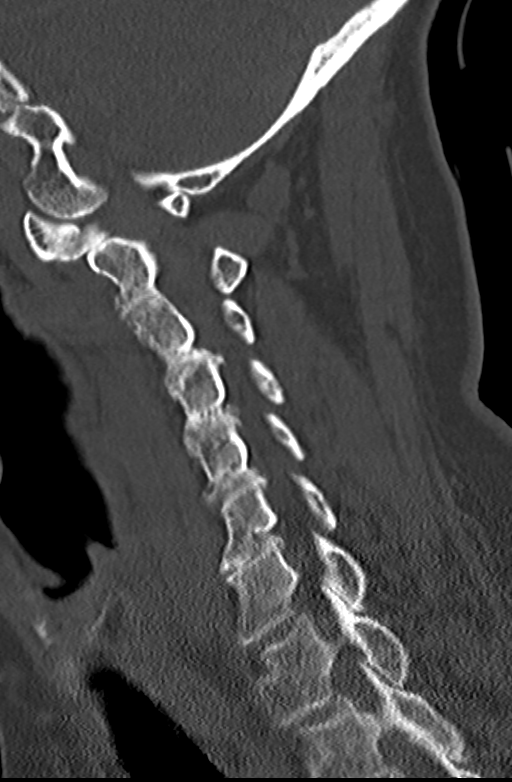
[im 17/40  bone]
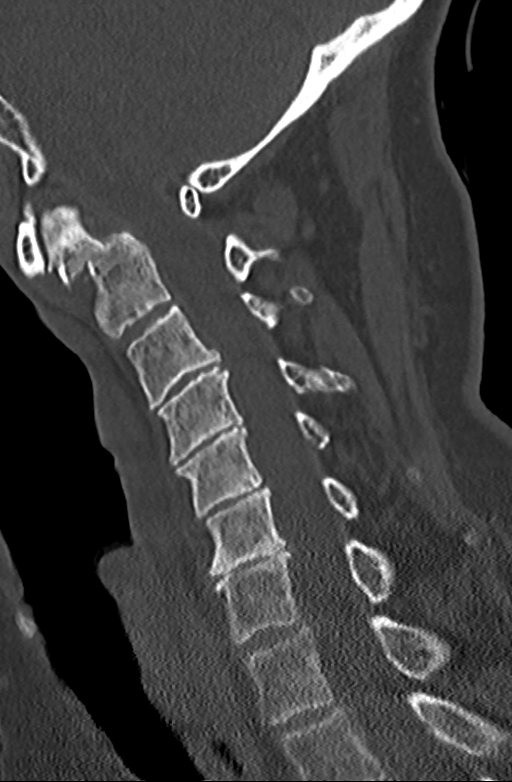
[im 20/40  soft-tissue]
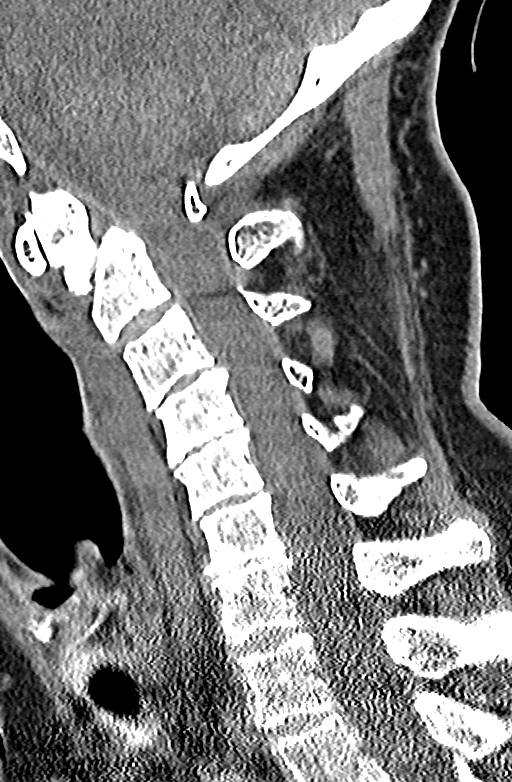
[im 20/40  bone]
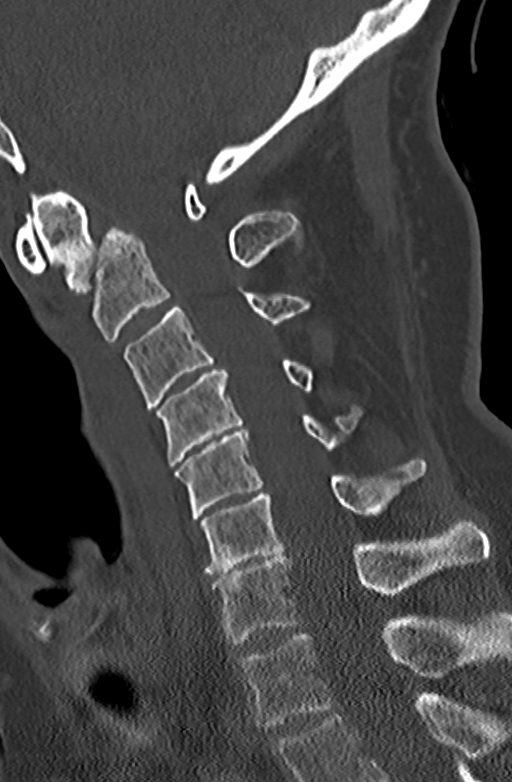
[im 23/40  bone]
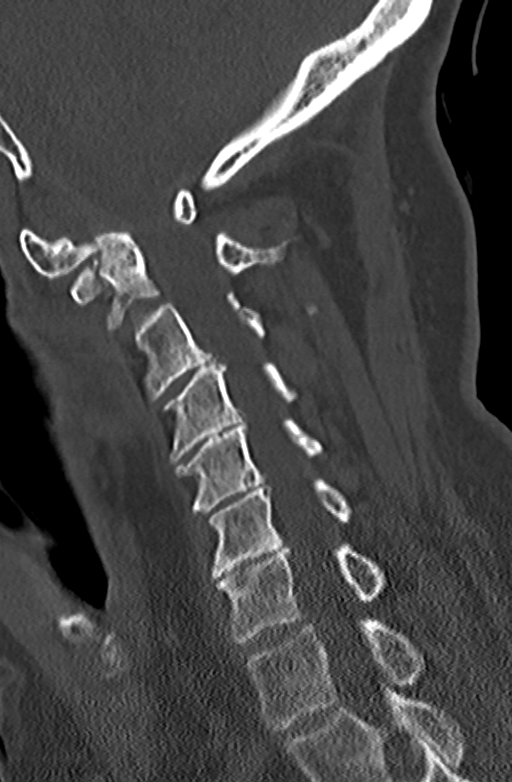
[im 27/40  bone]
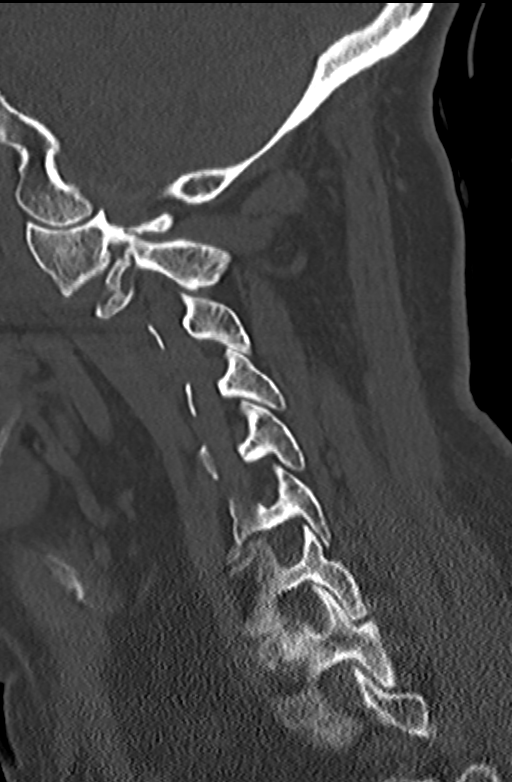

[Series 8: orthogonals · axial · 0.17mm/px · z∈[-102,+2]mm · 3 of 84 slices shown, 4 images]
[im 14/84  soft-tissue]
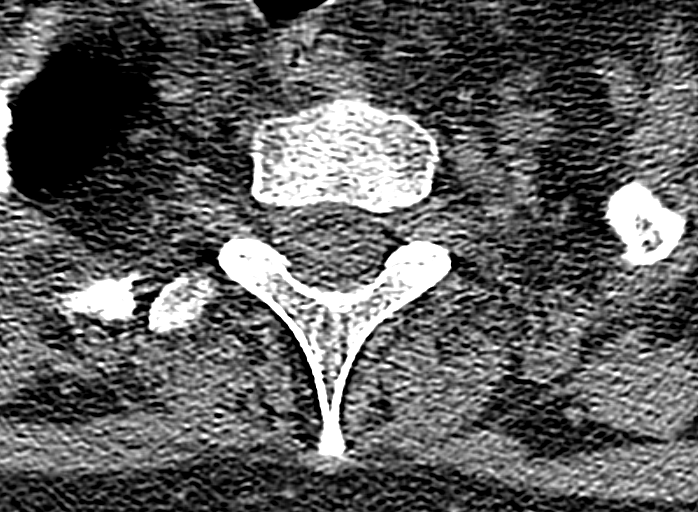
[im 14/84  bone]
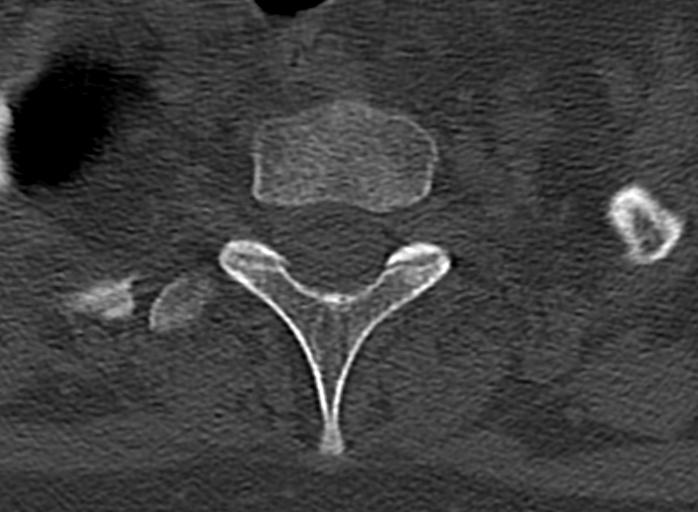
[im 42/84  bone]
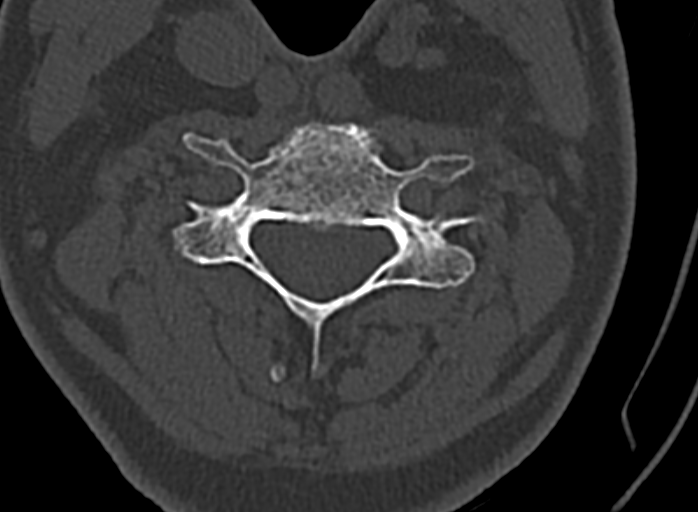
[im 70/84  bone]
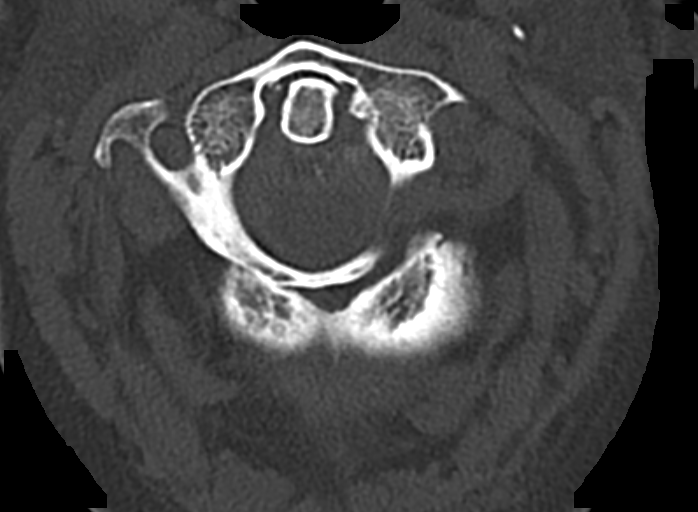

[11 of 33 positions shown; findings below may reference images not displayed]

FINDINGS: A recent fracture through the base of dens, extending into the left
C2 body, has undergone significant displacement anteriorly. Now, the
dens is displaced anteriorly by 6 mm. There has been translation of
the C1 arch with the dens resulting in the moderate spinal canal
stenosis between the posterior arch of C1 and the C2 body. The
minimal central AP canal diameter is 1 cm. The comminuted left body
fracture is depressed, with resultant widening of the C1-2 facet
joint. There is rotation of C1 on C2, with subluxation of the right
facet.

Unchanged alignment of a nondisplaced C3 left laminar fracture. No
interval displacement of a left C1 ring fracture, better visualized
on the previous study.

No gross cervical canal hematoma.

These results were called by telephone at the time of interpretation
on 04/17/2014 at [DATE] to Dr. Balke, who verbally acknowledged
these results.
IMPRESSION: 1. Significant interval displacement of a recent base of dens and C2
body fracture with new moderate spinal canal stenosis.
2. Unchanged, nondisplaced fractures of the C1 ring and C3 left
lamina.
